# Patient Record
Sex: Female | Born: 1955 | Race: White | Hispanic: No | Marital: Single | Smoking: Never smoker
Health system: Southern US, Community
[De-identification: ages and names within clinical notes are randomized; demographics above are authoritative.]

## PROBLEM LIST (undated history)

## (undated) DIAGNOSIS — K5790 Diverticulosis of intestine, part unspecified, without perforation or abscess without bleeding: Secondary | ICD-10-CM

## (undated) DIAGNOSIS — Z9989 Dependence on other enabling machines and devices: Secondary | ICD-10-CM

## (undated) DIAGNOSIS — E663 Overweight: Secondary | ICD-10-CM

## (undated) DIAGNOSIS — R112 Nausea with vomiting, unspecified: Secondary | ICD-10-CM

## (undated) DIAGNOSIS — E785 Hyperlipidemia, unspecified: Secondary | ICD-10-CM

## (undated) DIAGNOSIS — N939 Abnormal uterine and vaginal bleeding, unspecified: Secondary | ICD-10-CM

## (undated) DIAGNOSIS — Z5189 Encounter for other specified aftercare: Secondary | ICD-10-CM

## (undated) DIAGNOSIS — Z9889 Other specified postprocedural states: Secondary | ICD-10-CM

## (undated) DIAGNOSIS — N912 Amenorrhea, unspecified: Secondary | ICD-10-CM

## (undated) DIAGNOSIS — N643 Galactorrhea not associated with childbirth: Secondary | ICD-10-CM

## (undated) DIAGNOSIS — G4733 Obstructive sleep apnea (adult) (pediatric): Secondary | ICD-10-CM

## (undated) DIAGNOSIS — K635 Polyp of colon: Secondary | ICD-10-CM

## (undated) DIAGNOSIS — K760 Fatty (change of) liver, not elsewhere classified: Secondary | ICD-10-CM

## (undated) DIAGNOSIS — M1990 Unspecified osteoarthritis, unspecified site: Secondary | ICD-10-CM

## (undated) DIAGNOSIS — F329 Major depressive disorder, single episode, unspecified: Secondary | ICD-10-CM

## (undated) DIAGNOSIS — R896 Abnormal cytological findings in specimens from other organs, systems and tissues: Secondary | ICD-10-CM

## (undated) DIAGNOSIS — IMO0001 Reserved for inherently not codable concepts without codable children: Secondary | ICD-10-CM

## (undated) DIAGNOSIS — F32A Depression, unspecified: Secondary | ICD-10-CM

## (undated) DIAGNOSIS — N858 Other specified noninflammatory disorders of uterus: Secondary | ICD-10-CM

## (undated) DIAGNOSIS — I10 Essential (primary) hypertension: Secondary | ICD-10-CM

## (undated) HISTORY — PX: HYSTEROSCOPY: SHX211

## (undated) HISTORY — DX: Hyperlipidemia, unspecified: E78.5

## (undated) HISTORY — DX: Encounter for other specified aftercare: Z51.89

## (undated) HISTORY — PX: COSMETIC SURGERY: SHX468

## (undated) HISTORY — DX: Reserved for inherently not codable concepts without codable children: IMO0001

## (undated) HISTORY — DX: Depression, unspecified: F32.A

## (undated) HISTORY — DX: Other specified noninflammatory disorders of uterus: N85.8

## (undated) HISTORY — DX: Amenorrhea, unspecified: N91.2

## (undated) HISTORY — PX: ROTATOR CUFF REPAIR: SHX139

## (undated) HISTORY — DX: Essential (primary) hypertension: I10

## (undated) HISTORY — PX: TEAR DUCT PROBING: SHX793

## (undated) HISTORY — DX: Overweight: E66.3

## (undated) HISTORY — PX: CATARACT EXTRACTION: SUR2

## (undated) HISTORY — DX: Polyp of colon: K63.5

## (undated) HISTORY — DX: Abnormal uterine and vaginal bleeding, unspecified: N93.9

## (undated) HISTORY — DX: Major depressive disorder, single episode, unspecified: F32.9

## (undated) HISTORY — DX: Abnormal cytological findings in specimens from other organs, systems and tissues: R89.6

## (undated) HISTORY — DX: Diverticulosis of intestine, part unspecified, without perforation or abscess without bleeding: K57.90

## (undated) HISTORY — DX: Galactorrhea not associated with childbirth: N64.3

## (undated) HISTORY — PX: DILATION AND CURETTAGE OF UTERUS: SHX78

## (undated) HISTORY — PX: KNEE ARTHROPLASTY: SHX992

## (undated) HISTORY — PX: KNEE ARTHROSCOPY: SUR90

## (undated) HISTORY — DX: Obstructive sleep apnea (adult) (pediatric): G47.33

## (undated) HISTORY — PX: JOINT REPLACEMENT: SHX530

## (undated) HISTORY — DX: Dependence on other enabling machines and devices: Z99.89

## (undated) HISTORY — DX: Unspecified osteoarthritis, unspecified site: M19.90

## (undated) HISTORY — DX: Fatty (change of) liver, not elsewhere classified: K76.0

---

## 1997-10-25 ENCOUNTER — Other Ambulatory Visit: Admission: RE | Admit: 1997-10-25 | Discharge: 1997-10-25 | Payer: Self-pay | Admitting: Obstetrics and Gynecology

## 1999-04-03 ENCOUNTER — Other Ambulatory Visit: Admission: RE | Admit: 1999-04-03 | Discharge: 1999-04-03 | Payer: Self-pay | Admitting: Obstetrics and Gynecology

## 2000-10-19 ENCOUNTER — Inpatient Hospital Stay (HOSPITAL_COMMUNITY): Admission: RE | Admit: 2000-10-19 | Discharge: 2000-10-21 | Payer: Self-pay | Admitting: Orthopedic Surgery

## 2001-01-28 ENCOUNTER — Other Ambulatory Visit: Admission: RE | Admit: 2001-01-28 | Discharge: 2001-01-28 | Payer: Self-pay | Admitting: Obstetrics and Gynecology

## 2002-03-02 ENCOUNTER — Other Ambulatory Visit: Admission: RE | Admit: 2002-03-02 | Discharge: 2002-03-02 | Payer: Self-pay | Admitting: Obstetrics and Gynecology

## 2002-12-06 ENCOUNTER — Other Ambulatory Visit: Admission: RE | Admit: 2002-12-06 | Discharge: 2002-12-06 | Payer: Self-pay | Admitting: Obstetrics and Gynecology

## 2003-02-02 ENCOUNTER — Encounter (INDEPENDENT_AMBULATORY_CARE_PROVIDER_SITE_OTHER): Payer: Self-pay | Admitting: *Deleted

## 2003-02-02 ENCOUNTER — Ambulatory Visit (HOSPITAL_COMMUNITY): Admission: RE | Admit: 2003-02-02 | Discharge: 2003-02-02 | Payer: Self-pay | Admitting: Obstetrics and Gynecology

## 2003-10-25 ENCOUNTER — Ambulatory Visit (HOSPITAL_COMMUNITY): Admission: RE | Admit: 2003-10-25 | Discharge: 2003-10-25 | Payer: Self-pay | Admitting: Obstetrics and Gynecology

## 2004-02-13 ENCOUNTER — Other Ambulatory Visit: Admission: RE | Admit: 2004-02-13 | Discharge: 2004-02-13 | Payer: Self-pay | Admitting: Obstetrics and Gynecology

## 2005-07-09 ENCOUNTER — Other Ambulatory Visit: Admission: RE | Admit: 2005-07-09 | Discharge: 2005-07-09 | Payer: Self-pay | Admitting: Obstetrics and Gynecology

## 2005-12-17 LAB — HM COLONOSCOPY

## 2006-07-08 ENCOUNTER — Ambulatory Visit: Payer: Self-pay

## 2006-07-15 ENCOUNTER — Ambulatory Visit: Payer: Self-pay

## 2006-07-21 ENCOUNTER — Ambulatory Visit: Payer: Self-pay

## 2006-08-24 ENCOUNTER — Ambulatory Visit: Payer: Self-pay

## 2008-05-15 ENCOUNTER — Encounter: Admission: RE | Admit: 2008-05-15 | Discharge: 2008-05-15 | Payer: Self-pay | Admitting: Orthopedic Surgery

## 2008-09-12 DIAGNOSIS — I82409 Acute embolism and thrombosis of unspecified deep veins of unspecified lower extremity: Secondary | ICD-10-CM | POA: Insufficient documentation

## 2008-09-12 DIAGNOSIS — I10 Essential (primary) hypertension: Secondary | ICD-10-CM | POA: Insufficient documentation

## 2008-09-12 DIAGNOSIS — I809 Phlebitis and thrombophlebitis of unspecified site: Secondary | ICD-10-CM | POA: Insufficient documentation

## 2009-09-05 ENCOUNTER — Ambulatory Visit: Payer: Self-pay | Admitting: Vascular Surgery

## 2009-09-20 ENCOUNTER — Encounter: Admission: RE | Admit: 2009-09-20 | Discharge: 2009-09-20 | Payer: Self-pay | Admitting: Orthopedic Surgery

## 2009-11-07 ENCOUNTER — Ambulatory Visit (HOSPITAL_COMMUNITY): Admission: RE | Admit: 2009-11-07 | Discharge: 2009-11-07 | Payer: Self-pay | Admitting: Neurosurgery

## 2009-12-04 ENCOUNTER — Ambulatory Visit: Payer: Self-pay | Admitting: Vascular Surgery

## 2010-01-02 ENCOUNTER — Ambulatory Visit: Payer: Self-pay | Admitting: Vascular Surgery

## 2010-01-09 ENCOUNTER — Ambulatory Visit: Payer: Self-pay | Admitting: Vascular Surgery

## 2010-02-17 ENCOUNTER — Encounter: Payer: Self-pay | Admitting: Neurosurgery

## 2010-02-20 ENCOUNTER — Ambulatory Visit
Admission: RE | Admit: 2010-02-20 | Discharge: 2010-02-20 | Payer: Self-pay | Source: Home / Self Care | Attending: Vascular Surgery | Admitting: Vascular Surgery

## 2010-02-21 NOTE — Assessment & Plan Note (Signed)
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  She underwent successful ablation from the proximal calf to just below the saphenofemoral junction under local anesthesia without immediate complications.  She will be seen again in 1 week for continued follow-up.    Larina Earthly, M.D. Electronically Signed  TFE/MEDQ  D:  02/20/2010  T:  02/21/2010  Job:  [id number]

## 2010-02-27 ENCOUNTER — Ambulatory Visit: Payer: Self-pay

## 2010-02-27 ENCOUNTER — Encounter (INDEPENDENT_AMBULATORY_CARE_PROVIDER_SITE_OTHER): Payer: BC Managed Care – PPO

## 2010-02-27 ENCOUNTER — Ambulatory Visit (INDEPENDENT_AMBULATORY_CARE_PROVIDER_SITE_OTHER): Payer: BC Managed Care – PPO | Admitting: Vascular Surgery

## 2010-02-27 DIAGNOSIS — Z48812 Encounter for surgical aftercare following surgery on the circulatory system: Secondary | ICD-10-CM

## 2010-02-27 DIAGNOSIS — I87399 Chronic venous hypertension (idiopathic) with other complications of unspecified lower extremity: Secondary | ICD-10-CM

## 2010-02-27 DIAGNOSIS — I83893 Varicose veins of bilateral lower extremities with other complications: Secondary | ICD-10-CM

## 2010-02-28 ENCOUNTER — Ambulatory Visit (INDEPENDENT_AMBULATORY_CARE_PROVIDER_SITE_OTHER): Payer: BC Managed Care – PPO

## 2010-02-28 ENCOUNTER — Ambulatory Visit: Admit: 2010-02-28 | Payer: Self-pay | Admitting: Vascular Surgery

## 2010-02-28 DIAGNOSIS — I83893 Varicose veins of bilateral lower extremities with other complications: Secondary | ICD-10-CM

## 2010-03-08 NOTE — Procedures (Unsigned)
DUPLEX DEEP VENOUS EXAM - LOWER EXTREMITY  INDICATION:  Right greater saphenous vein laser ablation.  HISTORY:  Edema:  No. Trauma/Surgery:  Left greater saphenous vein laser ablation on 01/02/2010, right greater saphenous vein laser ablation on 02/20/2010. Pain:  No. PE:  No. Previous DVT:  No. Anticoagulants: Other:  DUPLEX EXAM:               CFV   SFV   PopV  PTV    GSV               R  L  R  L  R  L  R   L  R  L Thrombosis    o  o  o     o     o      + Spontaneous   +  +  +     +     +      o Phasic        +  +  +     +     +      o Augmentation  +  +  +     +     +      o Compressible  +  +  +     +     +      o Competent     +  0  +     o     +      o  Legend:  + - yes  o - no  p - partial  D - decreased  IMPRESSION: 1. The right greater saphenous vein appears totally occluded from the     distal insertion site to 0.6 cm from the right saphenofemoral     junction. 2. No evidence of deep venous thrombosis noted in the right lower     extremity. 3. Reflux of >500 milliseconds noted in the right popliteal vein, the     right saphenofemoral junction, and in the left common femoral vein.   _____________________________ Larina Earthly, M.D.  CH/MEDQ  D:  02/27/2010  T:  02/27/2010  Job:  [id number]

## 2010-03-11 NOTE — Assessment & Plan Note (Signed)
OFFICE VISIT  Allison Mcdaniel, Allison Mcdaniel DOB:  [birth year]                                       02/27/2010 NIDPO#:[id number]  This patient is status post right great saphenous vein ablation 1 week ago.  She had a left leg ablation  approximately 1 month ago.  She has the usual amount of mild soreness in her medial thigh from the ablation; otherwise has returned to her normal baseline.  She does have a duplex today which showed closure of her great saphenous vein and no evidence of DVT.  I am pleased with her result as is this patient.  She does have some prominent, painful, large  telangiectasia over her medial thighs bilaterally and will initiate sclerotherapy with these.    Larina Earthly, M.D. Electronically Signed  TFE/MEDQ  D:  02/27/2010  T:  02/28/2010  Job:  [id number]

## 2010-03-28 ENCOUNTER — Ambulatory Visit (INDEPENDENT_AMBULATORY_CARE_PROVIDER_SITE_OTHER): Payer: BC Managed Care – PPO

## 2010-03-28 DIAGNOSIS — I83893 Varicose veins of bilateral lower extremities with other complications: Secondary | ICD-10-CM

## 2010-04-11 LAB — GLUCOSE, CAPILLARY: Glucose-Capillary: 142 mg/dL — ABNORMAL HIGH (ref 70–99)

## 2010-06-11 NOTE — Assessment & Plan Note (Signed)
OFFICE VISIT   CHRISTLE, NOLTING  DOB:  [birth year]                                       01/02/2010  ZOXWR#:[id number]   The patient presents today for treatment of her left great saphenous  vein hypertension.  She underwent successful laser ablations from below  the knee to just below the saphenofemoral junction.  She had no  immediate complication and will be seen again in 1 week with duplex  followup.     Larina Earthly, M.D.  Electronically Signed   TFE/MEDQ  D:  01/02/2010  T:  01/03/2010  Job:  [id number]

## 2010-06-11 NOTE — Assessment & Plan Note (Signed)
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  She has  worn compression garments for 3 months since my last visit with her and  still reports pain despite this.  She does elevate her legs when  possible, does take ibuprofen for discomfort.  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  She also reports the  physical therapy requires standing when treating the patients and also  difficulty due to leg pain and has to take frequent breaks and also  disrupts her sleep due to the pain after she has been standing for great  periods of time during the day.   On physical exam there is no change.  She does not have any varicosities  but does have extensive telangiectasia bilaterally.  I reimaged her  saphenous vein with the SonoSite and this does show enlarged saphenous  vein with reflux bilaterally.  I have recommended staged bilateral laser  ablation of her great saphenous vein for reduction of hypertension.  I  did explain that her symptoms are quite typical for venous hypertension  but that there are other etiologies for leg discomfort as well.  There  is no assurance that ablation will completely resolve this issue.  She  understands and wishes to proceed at her earliest convenience.     Larina Earthly, M.D.  Electronically Signed   TFE/MEDQ  D:  12/04/2009  T:  12/04/2009  Job:  [id number]

## 2010-06-11 NOTE — Assessment & Plan Note (Signed)
OFFICE VISIT   BILL, MCVEY  DOB:  1955/10/29                                       01/09/2010  ZOXWR#:[id number]   Allison Mcdaniel presents today for a 1 week follow-up of her left leg great  saphenous ablation.  She has done quite well following the procedure  with the usual amount of soreness in her medial thigh.  She does have  some irritation in the skin directly behind her popliteal space in the  skin fold from the compression garment.  She will treat this with  Neosporin and keep this covered under the compression which she will  wear for an additional 1 week.  She is scheduled for right leg ablation  and will do this around her work schedule.  She will notify us when she  wishes to proceed.     Larina Earthly, M.D.  Electronically Signed   TFE/MEDQ  D:  01/09/2010  T:  01/10/2010  Job:  [id number]

## 2010-06-11 NOTE — Procedures (Signed)
LOWER EXTREMITY VENOUS REFLUX EXAM   INDICATION:  Varicose veins with pain and swelling.   EXAM:  Using color-flow imaging and pulse Doppler spectral analysis, the  bilateral common femoral, superficial femoral, popliteal, posterior  tibial, greater and lesser saphenous veins are evaluated.  There is no  evidence suggesting deep venous insufficiency in the bilateral lower  extremities.   The bilateral saphenofemoral junctions are not competent with reflux of  >575milliseconds. The bilateral GSV's are not competent with reflux of  >560milliseconds with the calibers as described below.   The bilateral proximal short saphenous vein demonstrate competency.   GSV Diameter (used if found to be incompetent only)                                            Right    Left  Proximal Greater Saphenous Vein           0.56 cm  0.47 cm  Proximal-to-mid-thigh                     cm       cm  Mid thigh                                 0.44 cm  0.49 cm  Mid-distal thigh                          cm       cm  Distal thigh                              0.48 cm  0.45 cm  Knee                                      0.53 cm  0.47 cm   IMPRESSION:  1. Bilateral greater saphenous vein reflux with >525milliseconds is      identified with the caliber ranging from 0.44 cm to 0.56 cm knee to      groin on the right leg and 0.45 to 0.49 on the left leg.  2. The bilateral deep greater saphenous veins are not aneurysmal.  3. The bilateral greater saphenous veins are not tortuous.  4. The deep venous system is competent.  5. The bilateral lesser saphenous veins are competent.           ___________________________________________  Larina Earthly, M.D.   CB/MEDQ  D:  09/06/2009  T:  09/06/2009  Job:  [id number]

## 2010-06-11 NOTE — Consult Note (Signed)
NEW PATIENT CONSULTATION   Mcdaniel, Allison C  DOB:  [birth year]                                       09/05/2009  AVWUJ#:[id number]   The patient presents today for evaluation of bilateral lower extremity  swelling.  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  This is from her knee distally bilaterally and is somewhat  more so on her left leg than right leg.  She does have a history of calf  vein DVT after orthopedic surgery on her knee in 1976 on the left leg  and on the right leg in 2008 after an arthroscopy .  She has worn  compression garments for many years and gets some mild relief from  these.  Her pain has become progressive over the past several years.  She is in our office to determine if there are any options for improved  pain relief.   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  She does have hypertension.  She has had multiple orthopedic  treatments in both the upper and lower extremities.   Family history is negative for premature atherosclerotic disease.   SOCIAL HISTORY:  She is single.  She does not have children.  She is a  physical therapist.  She does not smoke and does have 1 to 2 beers per  month.   REVIEW OF SYSTEMS:  Is negative for weight loss or weight gain.  She  weighs 240 pounds.  She is 5 feet 8 inches tall.  Cardiac, pulmonary, GI, GU are all negative.  VASCULAR:  Positive for her phlebitis as noted above.  NEUROLOGIC:  Negative.  MUSCULOSKELETAL:  Positive for arthritis, joint pain.  PSYCHIATRIC:  Depression.  ENT, hematologic again are all negative.   PHYSICAL EXAM:  A well-developed moderately obese white female appearing  her stated age.  Blood pressure is 126/82 pulse 80, respirations 18.  She is in no acute stress.  HEENT:  Normal.  Abdomen:  Moderately obese  and nontender.  Pulse status:  She  has 2+ radial and 2+ dorsalis pedis  pulses bilaterally.  Musculoskeletal:  Shows marked surgical changes in  her right knee from prior orthopedic surgeries.  She does not have any  cyanosis bilaterally.  She does have moderate edema bilaterally.  She  does have extensive telangiectasia over both lower extremities.  Neurological:  No focal weakness, paresthesias.  Skin:  Without ulcers  or rashes.   She underwent noninvasive laboratory studies in our office.  This shows  no evidence of deep venous reflux.  She does have reflux in her right  great saphenous vein from her groin to below the knee position.  On the  left she has enlarged saphenous vein throughout its course with reflux  at the saphenofemoral junction in the knee into the calf.  I discussed  options with the patient.  She has been wearing her compression garments  but I have again instructed her on the use of these with thigh high 20  to 30 mmHg.  She will wear these daily and we will see her again in 3  months to determine if daily use of compression garments will aid her  symptoms.  She would be a good candidate for staged bilateral  laser  ablation of her saphenous vein for relief of her venous hypertension.  We would recommend left leg then right leg since the left leg is most  symptomatic.  We will discuss this with her further in 3 months.     Larina Earthly, M.D.  Electronically Signed   TFE/MEDQ  D:  09/05/2009  T:  09/06/2009  Job:  [id number]

## 2010-06-11 NOTE — Procedures (Signed)
DUPLEX DEEP VENOUS EXAM - LOWER EXTREMITY   INDICATION:  One week post left greater saphenous vein EVLT.   HISTORY:  Edema:  Left.  Trauma/Surgery:  01/02/10, left greater saphenous vein EVLT.  Pain:  Left.  PE:  No.  Previous DVT:  History of DVT and phlebitis.  Anticoagulants:  No.  Other:   DUPLEX EXAM:                CFV   SFV   PopV  PTV    GSV                R  L  R  L  R  L  R   L  R  L  Thrombosis    o  o     o     o      o     +  Spontaneous   +  +     +     +      +     o  Phasic        +  +     +     +      +     o  Augmentation  +  +     +     +      +     o  Compressible  +  +     +     +      +     o  Competent     +  +     +     +      +     o   Legend:  + - yes  o - no  p - partial  D - decreased   IMPRESSION:  1. Left leg appears to be negative for deep venous thrombosis.  2. Left greater saphenous vein is thrombosed due to EVLT.  3. Right common femoral vein appears to be negative for deep venous      thrombosis.    _____________________________  Larina Earthly, M.D.   NT/MEDQ  D:  01/09/2010  T:  01/09/2010  Job:  [id number]

## 2010-06-14 NOTE — Op Note (Signed)
Allison Mcdaniel, Allison Mcdaniel                 ACCOUNT NO.:  [id number]   MEDICAL RECORD NO.:  [id number]          PATIENT TYPE:  AMB   LOCATION:  SDC                           FACILITY:  [facility]   PHYSICIAN:  Hal Morales, M.D.DATE OF BIRTH:  1955-06-18   DATE OF PROCEDURE:  10/25/2003  DATE OF DISCHARGE:                                 OPERATIVE REPORT   PREOPERATIVE DIAGNOSES:  Menorrhagia.   POSTOPERATIVE DIAGNOSES:  Menorrhagia.   OPERATION:  Novasure endometrial ablation.   ANESTHESIA:  General oral tracheal.   ESTIMATED BLOOD LOSS:  Less than 10 mL.   COMPLICATIONS:  None.   FINDINGS:  The uterus sounded to 9 cm with a 3 cm cervical length.   DESCRIPTION OF PROCEDURE:  The patient was taken to the operating room after  appropriate identification and placed on the operating table. After the  attainment of adequate general anesthesia, she was placed in the lithotomy  position. The perineum and vagina were prepped with multiple layers of  Betadine and draped as a sterile field. A Grave's speculum was used to  visualize the cervix and the cervical length measured with a Hegar dilator.  The uterus was then sounded to 9 cm. The Novasure apparatus was then set at  6 cm.  It was then inserted into the endometrial cavity after the cervix had  been dilated to a #23 dilator.  The Novasure apparatus was advanced to the  fundus and then withdrawn 2 cm.  The array was then opened and the cavity  width noted initially to be 2.25 cm.  Once the maneuvers were completed to  allow full opening of the array, the cavity width was noted to be 4.6 cm.  At this point, the test mode was activated and the cavity noted to be  intact.  The Novasure ablation was then begun and completed after 44  seconds.  The Novasure array was then unlocked and retracted back into the  holder and then removed from the endometrial cavity. The tenaculum which had  been used to hold the cervix in place was released and silver  nitrate  applied to the bleeding sites from the tenaculum. Hemostasis was then noted  to be adequate and all instruments removed from the vagina.  It should be  noted that prior to insertion of the Grave's speculum, the patient had been  catheterized with a red Robinson catheter under sterile condition. Once all  instruments had been removed, the patient was awakened from general  anesthesia and taken to the recovery room in satisfactory condition having  tolerated the procedure well with sponge and instrument counts correct.      VPH/MEDQ  D:  10/25/2003  T:  10/25/2003  Job:  [id number]

## 2010-06-14 NOTE — Op Note (Signed)
NAME:  Allison Mcdaniel, Allison Mcdaniel                           ACCOUNT NO.:  [id number]   MEDICAL RECORD NO.:  [id number]                   PATIENT TYPE:  AMB   LOCATION:  SDC                                  FACILITY:  [facility]   PHYSICIAN:  Hal Morales, M.D.             DATE OF BIRTH:  07-Mar-1955   DATE OF PROCEDURE:  02/02/2003  DATE OF DISCHARGE:                                 OPERATIVE REPORT   PREOPERATIVE DIAGNOSIS:  Intermenstrual bleeding.   POSTOPERATIVE DIAGNOSES:  1. Intermenstrual bleeding.  2. Endometrial polyp.   OPERATION:  1. Diagnostic hysteroscopy.  2. Polypectomy.  3. Endometrial curettage.   SURGEON:  Hal Morales, M.D.   ANESTHESIA:  General orotracheal.   ESTIMATED BLOOD LOSS:  Less than 50 mL.   COMPLICATIONS:  None.   FINDINGS:  The uterus sounded to 8 cm.  At the time of hysteroscopy there  were several polyps noted, first on the anterior fundus of the uterus, then  on the right fundus near the right tubal ostium.   SPECIMENS TO PATHOLOGY:  Endometrial curettings, endometrial polyps,  endocervical curettings.   DESCRIPTION OF PROCEDURE:  The patient was taken to the operating room after  appropriate identification and placed on the operating table.  After the  attainment of adequate general anesthesia, she was placed in the lithotomy  position.  Care was taken to place the patient so as not to stress the left  knee, which is status post reconstruction.  The perineum was then draped as  a sterile field.  A red Robinson catheter was used to empty the bladder.  The perineum was draped as a sterile field and the Graves speculum placed in  the vagina and a single-tooth tenaculum placed on the anterior cervix.  A  paracervical block was achieved with a total of 10 mL of 2% Xylocaine at the  5 and 7 o'clock positions.  The uterus was sounded to 8 cm.  The cervix was  dilated to accommodate the diagnostic hysteroscope.  The diagnostic  hysteroscope was then  used to achieve the above-noted findings.  The  diagnostic hysteroscope was removed and the Randall stone forceps used to  remove the aforementioned polyps.  Sharp curettage of all quadrants of the  uterus was then undertaken and the hysteroscope replaced to document that  the aforementioned polyp had been removed.  All instruments were then  removed from the vagina and the patient was  awakened from general anesthesia and taken to the recovery room in  satisfactory condition, having tolerated the procedure well with sponge and  instrument counts correct.  The deficit at the time of the end of  hysteroscopy was 30 mL.  Hal Morales, M.D.    VPH/MEDQ  D:  02/02/2003  T:  02/02/2003  Job:  [id number]

## 2010-06-14 NOTE — Procedures (Signed)
[place]  Patient:    Allison Mcdaniel, Allison Mcdaniel Visit Number: [id number] MRN: [id number]          Service Type: SUR Location: [facility] 2550 04 Attending Physician:  Twana First Dictated by:   Burna Forts, M.D. Proc. Date: 10/19/00 Admit Date:  10/19/2000                             Procedure Report  PREOPERATIVE DIAGNOSIS:  Degenerative joint disease of the knee.  OPERATIVE PROCEDURE:  Total knee replacement performed by Dr. Hadassah Pais.  ANESTHESIA:  Epidural catheter for postoperative analgesia.  PROCEDURE:  Epidural catheter for postoperative analgesia.  Preoperative risks and benefits of the placement of the epidural catheter for use for postoperative analgesia were discussed with the patient in detail, including alternatives for pain control.  Additionally, this had been requested by her attending surgeon Dr. Thurston Hole for postoperative analgesia. The patient accepted the placement of the epidural catheter for postoperative analgesic care.  At the end of the operative procedure the patient was turned onto the left lateral decubitus position, a sterile prep of the lumbar area was conducted using a #17 gauge Tuohy needle adjacent to the L1-2 interspace.  The epidural space was contacted with a loss of resistance technique.  A catheter threaded with ease, approximately 2 to 3 cm beyond the needle tip, and the needle was removed.  After negative aspiration for heme and CSF, the catheter was incrementally injected with a total of 10 cc of 0.25% Marcaine containing 100 mcg of phentanyl.  This was secured in place with tape.  The patient turned supine, transferred to the [facility] in stable condition. Dictated by:   Burna Forts, M.D. Attending Physician:  Twana First DD:  10/19/00 TD:  10/19/00 Job: [id number]

## 2010-06-14 NOTE — Op Note (Signed)
[place]  Patient:    Allison Mcdaniel, Allison Mcdaniel Visit Number: [id number] MRN: [id number]          Service Type: SUR Location: RCRM 2550 04 Attending Physician:  Twana First Dictated by:   Elana Alm Thurston Hole, M.D. Proc. Date: 10/19/00 Admit Date:  10/19/2000                             Operative Report  PREOPERATIVE DIAGNOSIS:  Left knee degenerative joint disease.  POSTOPERATIVE DIAGNOSIS:  Left knee degenerative joint disease.  PROCEDURE:  Left total knee replacement using Osteonics Scorpio total knee system with #9 cemented femoral component, #9 cemented tibial component with 15-mm polyethylene tibial spacer and 26-mm polyethylene cemented patella.  SURGEON:  Elana Alm. Thurston Hole, M.D.  ASSISTANT:  Julien Girt, P.A.  ANESTHESIA:  General.  OPERATIVE TIME:  1 hour and 25 minutes.  COMPLICATIONS:  None.  DESCRIPTION OF PROCEDURE:  Allison Mcdaniel was brought to the operating room on October 19, 2000, placed on the operative table in the supine position. After an adequate level of general anesthesia was obtained, her left knee was examined under anesthesia.  Moderate varus deformity noted.  Range of motion 0-125 degrees.  Knee stable ligamentous exam with normal patella tracking. After this was done, the knee was prepped using sterile Betadine and draped using sterile technique.  She had a Foley catheter placed under sterile conditions and received Ancef 1 g IV preoperatively for prophylaxis.  The left leg was exsanguinated and a thigh tourniquet elevated to 375 mm.  Initially, through a 25-cm longitudinal incision based over the patella, initial exposure was made.  The underlying subcutaneous tissues were incised in line with skin incision.  A median arthrotomy was performed revealing excessive amount of normal-appearing joint fluid.  The articular surfaces in the knee were examined.  She had grade 4 changes medially, grade 3 changes  laterally, and grade 3 and 4 changes in the patellofemoral joint.  Osteophytes were removed from the femoral condyles and the patella.  The medial and lateral meniscal remnants were removed, as well as the anterior cruciate ligament.  An intramedullary drill was then drilled up the femoral canal for placement of the distal femoral cutting jig, which was placed in the appropriate amount of rotation and a distal 10-mm cut was made.  The distal femur was incised.  A #9 was felt to be the appropriate size and a #9 cutting jig was placed and then these cuts were made.  The proximal tibia was exposed.  Tibia spines were removed with an oscillating saw.  Intramedullary drill drilled down the tibial canal for placement of the proximal tibial cutting jig.  This was placed and a 4-mm recessed cut was made based off the medial side.  After this was done, then the Scorpio PCL cutter was placed back on the distal femur and these cuts were made.  At this point then, the #9 femoral trial was placed and #9 tibial trial baseplate was placed with a 15-mm polyethylene spacer, the knee taken through a range of motion and found to be stable, the varus deformity found to be corrected.  At this point, the tibial baseplate was marked for rotation and then the keel cut was made.  After this was done, the patella was sized.  A 26-mm was found to be the appropriate size and a recessed 10 x 26 mm cut was made and three locking holes  were placed.  After this was done, it was felt that all the trial components were of excellent size, fit, stability.  They were removed.  The knee was then gently lavage irrigated with 3 L of saline. The proximal tibia was then exposed.  The #9 tibial baseplate was placed with cement backing and hammered into position with an excellent fit with excess cement being removed from around the edges.  The femoral component #9 with cement backing was hammered into position, also with an excellent  fit, with excess cement being removed from around the edges.  The 15-mm polyethylene spacer was locked on the tibial baseplate, the knee taken through a range of motion 0-125 degrees with excellent stability, and no lift-off on the tray. The 26-mm patella was then locked into its recessed hole with cement backing and excess cement being removed from around the edges.  After the cement hardened, patellofemoral tracking was evaluated.  This was found to be normal with no need for a lateral release.  At this point, it was felt that all the components were of excellent size, fit, and stability.  The knee was further irrigated with antibiotic solution and then the arthrotomy was closed with #1 Ethibond suture over two medium Hemovac drains.  Subcutaneous tissues were closed with 0 and 2-0 Vicryl, skin closed with skin staples, sterile dressings were applied, and a long leg splint.  Hemovac injected with 0.25% Marcaine with epinephrine and clamp.  Tourniquet was released.  The patient then had an epidural catheter placed by anesthesia for postoperative pain control.  She was then awakened and taken to the recovery room in stable condition.  Needle and sponge counts were correct x 2 at the end of the case. Dictated by:   Elana Alm Thurston Hole, M.D. Attending Physician:  Twana First DD:  10/19/00 TD:  10/19/00 Job: [id number]
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
## 2010-07-18 LAB — HM MAMMOGRAPHY: HM Mammogram: NORMAL

## 2010-09-27 ENCOUNTER — Other Ambulatory Visit: Payer: Self-pay | Admitting: Dermatology

## 2011-02-17 ENCOUNTER — Ambulatory Visit (INDEPENDENT_AMBULATORY_CARE_PROVIDER_SITE_OTHER): Payer: BC Managed Care – PPO | Admitting: Family Medicine

## 2011-02-17 ENCOUNTER — Encounter: Payer: Self-pay | Admitting: Family Medicine

## 2011-02-17 DIAGNOSIS — G4733 Obstructive sleep apnea (adult) (pediatric): Secondary | ICD-10-CM | POA: Insufficient documentation

## 2011-02-17 DIAGNOSIS — K760 Fatty (change of) liver, not elsewhere classified: Secondary | ICD-10-CM | POA: Insufficient documentation

## 2011-02-17 DIAGNOSIS — Z9989 Dependence on other enabling machines and devices: Secondary | ICD-10-CM | POA: Insufficient documentation

## 2011-02-17 DIAGNOSIS — F32A Depression, unspecified: Secondary | ICD-10-CM | POA: Insufficient documentation

## 2011-02-17 DIAGNOSIS — K5730 Diverticulosis of large intestine without perforation or abscess without bleeding: Secondary | ICD-10-CM | POA: Insufficient documentation

## 2011-02-17 DIAGNOSIS — E785 Hyperlipidemia, unspecified: Secondary | ICD-10-CM

## 2011-02-17 DIAGNOSIS — I10 Essential (primary) hypertension: Secondary | ICD-10-CM

## 2011-02-17 DIAGNOSIS — F329 Major depressive disorder, single episode, unspecified: Secondary | ICD-10-CM

## 2011-02-17 DIAGNOSIS — E119 Type 2 diabetes mellitus without complications: Secondary | ICD-10-CM | POA: Insufficient documentation

## 2011-02-17 LAB — HEMOGLOBIN A1C: Hgb A1c MFr Bld: 7.5 % — AB (ref 4.0–6.0)

## 2011-03-19 ENCOUNTER — Encounter: Payer: Self-pay | Admitting: *Deleted

## 2011-03-19 ENCOUNTER — Ambulatory Visit (INDEPENDENT_AMBULATORY_CARE_PROVIDER_SITE_OTHER): Payer: BC Managed Care – PPO | Admitting: *Deleted

## 2011-03-19 DIAGNOSIS — I83893 Varicose veins of bilateral lower extremities with other complications: Secondary | ICD-10-CM

## 2011-03-19 DIAGNOSIS — I781 Nevus, non-neoplastic: Secondary | ICD-10-CM

## 2011-03-19 NOTE — Progress Notes (Signed)
X=.3% Sotradecol administered with a 27g butterfly.  Patient received a total of 12cc. Treated all I could with 2 syringes. She'll need more treatments. Will follow prn.   Photos: yes  Compression stockings applied: yes

## 2011-03-20 ENCOUNTER — Encounter: Payer: Self-pay | Admitting: Vascular Surgery

## 2011-03-30 ENCOUNTER — Other Ambulatory Visit: Payer: Self-pay | Admitting: Family Medicine

## 2011-04-19 ENCOUNTER — Ambulatory Visit (INDEPENDENT_AMBULATORY_CARE_PROVIDER_SITE_OTHER): Payer: BC Managed Care – PPO | Admitting: Family Medicine

## 2011-04-19 ENCOUNTER — Ambulatory Visit: Payer: BC Managed Care – PPO

## 2011-04-19 DIAGNOSIS — R109 Unspecified abdominal pain: Secondary | ICD-10-CM

## 2011-04-19 LAB — POCT URINALYSIS DIPSTICK
Bilirubin, UA: NEGATIVE
Blood, UA: NEGATIVE
Glucose, UA: NEGATIVE
Ketones, UA: NEGATIVE
Leukocytes, UA: NEGATIVE
Nitrite, UA: NEGATIVE
Protein, UA: NEGATIVE
Spec Grav, UA: 1.015
Urobilinogen, UA: 0.2
pH, UA: 7.5

## 2011-04-19 LAB — POCT CBC
Granulocyte percent: 57.3 %G (ref 37–80)
Hemoglobin: 12.8 g/dL (ref 12.2–16.2)
Lymph, poc: 2.6 (ref 0.6–3.4)
MCHC: 32.3 g/dL (ref 31.8–35.4)
MCV: 92.4 fL (ref 80–97)
MID (cbc): 0.5 (ref 0–0.9)
POC Granulocyte: 4.1 (ref 2–6.9)
POC LYMPH PERCENT: 36.2 %L (ref 10–50)
POC MID %: 6.5 %M (ref 0–12)
Platelet Count, POC: 308 10*3/uL (ref 142–424)
RBC: 4.29 M/uL (ref 4.04–5.48)
RDW, POC: 13.4 %

## 2011-04-19 LAB — POCT UA - MICROSCOPIC ONLY
Casts, Ur, LPF, POC: NEGATIVE
Crystals, Ur, HPF, POC: NEGATIVE
Mucus, UA: NEGATIVE
RBC, urine, microscopic: NEGATIVE
Yeast, UA: NEGATIVE

## 2011-04-19 LAB — COMPREHENSIVE METABOLIC PANEL
AST: 24 U/L (ref 0–37)
Albumin: 4.5 g/dL (ref 3.5–5.2)
Alkaline Phosphatase: 81 U/L (ref 39–117)
Calcium: 10.1 mg/dL (ref 8.4–10.5)
Glucose, Bld: 143 mg/dL — ABNORMAL HIGH (ref 70–99)
Potassium: 4.5 mEq/L (ref 3.5–5.3)
Sodium: 141 mEq/L (ref 135–145)
Total Bilirubin: 0.5 mg/dL (ref 0.3–1.2)
Total Protein: 6.8 g/dL (ref 6.0–8.3)

## 2011-04-19 NOTE — Progress Notes (Signed)
Subjective:    Patient ID: Allison Mcdaniel, female    DOB: [birth year], 56 y.o.   MRN: [id number]  HPI 56 yo female with multiple medical problems here with abdominal complaints.  Hasn't felt well in 2 weeks.  Malaise, fleeting discomfort in abdomen.  Tired.  Has been dieting and eating better since January.  Has lost 15#.  Felt better initially but now bad.  Doing protein shakes.  Occasional nausea.  No constipation or diarrhea.  No fever.  No vomitting.  Pain not severe.   Some baseline discomfort - "awareness of discomfort".  Not sharp.  Vague and worse with certain movement occasionally.     Review of Systems Negative except as per HPI     Objective:   Physical Exam  Constitutional: Vital signs are normal. She appears well-developed and well-nourished. She is active.  Cardiovascular: Normal rate, regular rhythm, normal heart sounds and normal pulses.   Pulmonary/Chest: Effort normal and breath sounds normal.  Abdominal: Soft. Normal appearance and bowel sounds are normal. She exhibits no distension and no mass. There is no hepatosplenomegaly. There is tenderness. There is no rigidity, no rebound, no guarding, no CVA tenderness, no tenderness at McBurney's point and negative Murphy's sign. No hernia.  Neurological: She is alert.    Tenderness across lower abdomen, no discrete point tenderness.   Results for orders placed in visit on 04/19/11  POCT CBC      Component Value Range   WBC 7.1  4.6 - 10.2 (K/uL)   Lymph, poc 2.6  0.6 - 3.4    POC LYMPH PERCENT 36.2  10 - 50 (%L)   MID (cbc) 0.5  0 - 0.9    POC MID % 6.5  0 - 12 (%M)   POC Granulocyte 4.1  2 - 6.9    Granulocyte percent 57.3  37 - 80 (%G)   RBC 4.29  4.04 - 5.48 (M/uL)   Hemoglobin 12.8  12.2 - 16.2 (g/dL)   HCT, POC 13.2  44.0 - 47.9 (%)   MCV 92.4  80 - 97 (fL)   MCH, POC 29.8  27 - 31.2 (pg)   MCHC 32.3  31.8 - 35.4 (g/dL)   RDW, POC 10.2     Platelet Count, POC 308  142 - 424 (K/uL)   MPV 8.5  0 - 99.8 (fL)    POCT UA - MICROSCOPIC ONLY      Component Value Range   WBC, Ur, HPF, POC 0-1     RBC, urine, microscopic neg     Bacteria, U Microscopic trace     Mucus, UA neg     Epithelial cells, urine per micros 0-2     Crystals, Ur, HPF, POC neg     Casts, Ur, LPF, POC neg     Yeast, UA neg    POCT URINALYSIS DIPSTICK      Component Value Range   Color, UA yellow     Clarity, UA clear     Glucose, UA neg     Bilirubin, UA neg     Ketones, UA neg     Spec Grav, UA 1.015     Blood, UA neg     pH, UA 7.5     Protein, UA neg     Urobilinogen, UA 0.2     Nitrite, UA neg     Leukocytes, UA Negative     [facility] Primary radiology reading by Dr. Georgiana Shore: NAD    Assessment & Plan:  Malaise and vague abdominal discomfort.  Normal CBC, urine, and xray.  Await CMET to ensure kidney function stable and NAFLD stable.  If persists another 1-2 weeks, will do CT abd/pelvix.

## 2011-05-12 ENCOUNTER — Telehealth: Payer: Self-pay

## 2011-05-12 NOTE — Telephone Encounter (Signed)
As Dr. Hal Hope has not seen her for many months, I suggest she get an order from Dr. Arbie Cookey for this.  Alternatively, she could go to the [facility] on [address] and they can fit her with a good orthotic.  Otherwise I suggest she RTC to see Dr. Hal Hope.

## 2011-05-12 NOTE — Telephone Encounter (Signed)
PT STATES DR Hal Hope SUGGESTED SHE GET SOME ORTHODIC TO PUT IN HER SHOES, BUT SHE NEED AN ORDER SENT TO [facility] IN ORDER TO GET THEM PLEASE CALL PT AT [phone] OR HER CELL AT [phone]

## 2011-05-15 NOTE — Telephone Encounter (Signed)
Patient is well known to me.  Given her diabeties I am comfortable prescribing the orthotics.  Please call her and inquire how I deliver the Rx to [facility]. Is patient to pick up prescription or is there a fax # that we can directly fax the prescription to.

## 2011-05-16 NOTE — Telephone Encounter (Signed)
LMOM to call back

## 2011-05-18 NOTE — Telephone Encounter (Signed)
Spoke with pt, she has appt with Dr. Hal Hope tomorrow so she can just get RX for orthotics at that time

## 2011-05-19 ENCOUNTER — Ambulatory Visit (INDEPENDENT_AMBULATORY_CARE_PROVIDER_SITE_OTHER): Payer: BC Managed Care – PPO | Admitting: Family Medicine

## 2011-05-19 ENCOUNTER — Encounter: Payer: Self-pay | Admitting: Family Medicine

## 2011-05-19 VITALS — BP 118/77 | HR 56 | Temp 96.5°F | Resp 20 | Ht 68.5 in | Wt 242.0 lb

## 2011-05-19 DIAGNOSIS — E785 Hyperlipidemia, unspecified: Secondary | ICD-10-CM

## 2011-05-19 DIAGNOSIS — K760 Fatty (change of) liver, not elsewhere classified: Secondary | ICD-10-CM

## 2011-05-19 DIAGNOSIS — E119 Type 2 diabetes mellitus without complications: Secondary | ICD-10-CM

## 2011-05-19 DIAGNOSIS — G4733 Obstructive sleep apnea (adult) (pediatric): Secondary | ICD-10-CM

## 2011-05-19 DIAGNOSIS — F32A Depression, unspecified: Secondary | ICD-10-CM

## 2011-05-19 DIAGNOSIS — I10 Essential (primary) hypertension: Secondary | ICD-10-CM

## 2011-05-19 DIAGNOSIS — F329 Major depressive disorder, single episode, unspecified: Secondary | ICD-10-CM

## 2011-05-19 LAB — LIPID PANEL
Cholesterol: 213 mg/dL — ABNORMAL HIGH (ref 0–200)
HDL: 43 mg/dL (ref >39)
LDL Cholesterol: 128 mg/dL — ABNORMAL HIGH (ref 0–99)
Total CHOL/HDL Ratio: 5 Ratio
VLDL: 42 mg/dL — ABNORMAL HIGH (ref 0–40)

## 2011-05-19 LAB — POCT GLYCOSYLATED HEMOGLOBIN (HGB A1C): Hemoglobin A1C: 6.4

## 2011-05-19 MED ORDER — LISINOPRIL-HYDROCHLOROTHIAZIDE 10-12.5 MG PO TABS: 1.0000 | ORAL_TABLET | Freq: Every day | ORAL | Status: DC

## 2011-05-19 MED ORDER — METFORMIN HCL 500 MG PO TABS: 1000.0000 mg | ORAL_TABLET | Freq: Two times a day (BID) | ORAL | Status: DC

## 2011-05-19 MED ORDER — METOPROLOL TARTRATE 25 MG PO TABS: 25.0000 mg | ORAL_TABLET | Freq: Two times a day (BID) | ORAL | Status: DC

## 2011-05-19 MED ORDER — FENOFIBRATE 145 MG PO TABS: 145.0000 mg | ORAL_TABLET | Freq: Every day | ORAL | Status: DC

## 2011-05-19 NOTE — Progress Notes (Signed)
Subjective:    Patient ID: Allison Mcdaniel, female    DOB: 01-11-1956, 56 y.o.   MRN: [id number]  HPI  Patient presents in routine follow up of her multiple medical problems.  Had taken supplements to lose weight recently apparently they made her sick which resulted in an urgent care OV. Patient continues to inquire options for weight loss. " I just love to eat.I have so many triggers to over eat"   Hiking regularly  Compliant with medications; denies side effects. Review of Systems     Objective:   Physical Exam  Constitutional: She appears well-developed.  HENT:  Head: Normocephalic and atraumatic.  Neck: Neck supple. Carotid bruit is not present. No thyromegaly present.  Cardiovascular: Normal rate, regular rhythm, normal heart sounds and intact distal pulses.   Pulmonary/Chest: Effort normal and breath sounds normal.  Abdominal: Soft. Bowel sounds are normal. She exhibits no mass. There is no hepatosplenomegaly. There is no tenderness.  Musculoskeletal: Normal range of motion.  Neurological: She is alert.  Skin:       Multiple varicosities No LE lesions  Psychiatric: She has a normal mood and affect.     Results for orders placed in visit on 05/19/11  POCT GLYCOSYLATED HEMOGLOBIN (HGB A1C)      Component Value Range   Hemoglobin A1C 6.4         Assessment & Plan:   1. DM (diabetes mellitus)  metFORMIN (GLUCOPHAGE) 500 MG tablet, fenofibrate (TRICOR) 145 MG tablet, POCT glycosylated hemoglobin (Hb A1C)  2. HYPERTENSION  metoprolol tartrate (LOPRESSOR) 25 MG tablet, lisinopril-hydrochlorothiazide (PRINZIDE,ZESTORETIC) 10-12.5 MG per tablet  3. Hyperlipidemia  Lipid panel  4. OSA on CPAP    5. NAFLD (nonalcoholic fatty liver disease)  Lipid panel  6. Depression      Weight loss options discussed to include weight watchers, TOPS, and bariatric procedures. Rx for orthotics Rx for glucometer supplies

## 2011-05-22 ENCOUNTER — Other Ambulatory Visit: Payer: Self-pay | Admitting: Dermatology

## 2011-08-20 ENCOUNTER — Ambulatory Visit: Payer: BC Managed Care – PPO | Admitting: Family Medicine

## 2011-09-03 ENCOUNTER — Ambulatory Visit: Payer: Self-pay | Admitting: Obstetrics and Gynecology

## 2011-09-03 DIAGNOSIS — N643 Galactorrhea not associated with childbirth: Secondary | ICD-10-CM | POA: Insufficient documentation

## 2011-09-03 DIAGNOSIS — N858 Other specified noninflammatory disorders of uterus: Secondary | ICD-10-CM | POA: Insufficient documentation

## 2011-09-03 DIAGNOSIS — E663 Overweight: Secondary | ICD-10-CM | POA: Insufficient documentation

## 2011-09-03 DIAGNOSIS — IMO0001 Reserved for inherently not codable concepts without codable children: Secondary | ICD-10-CM | POA: Insufficient documentation

## 2011-09-03 DIAGNOSIS — N912 Amenorrhea, unspecified: Secondary | ICD-10-CM | POA: Insufficient documentation

## 2011-09-03 DIAGNOSIS — K635 Polyp of colon: Secondary | ICD-10-CM | POA: Insufficient documentation

## 2011-09-03 DIAGNOSIS — N939 Abnormal uterine and vaginal bleeding, unspecified: Secondary | ICD-10-CM | POA: Insufficient documentation

## 2011-09-09 ENCOUNTER — Encounter: Payer: Self-pay | Admitting: Obstetrics and Gynecology

## 2011-09-09 ENCOUNTER — Ambulatory Visit (INDEPENDENT_AMBULATORY_CARE_PROVIDER_SITE_OTHER): Payer: BC Managed Care – PPO | Admitting: Obstetrics and Gynecology

## 2011-09-09 VITALS — BP 122/66 | Ht 68.5 in | Wt 249.0 lb

## 2011-09-09 DIAGNOSIS — E663 Overweight: Secondary | ICD-10-CM

## 2011-09-09 DIAGNOSIS — N912 Amenorrhea, unspecified: Secondary | ICD-10-CM

## 2011-09-09 DIAGNOSIS — R922 Inconclusive mammogram: Secondary | ICD-10-CM

## 2011-09-09 NOTE — Progress Notes (Signed)
AEX  Last Pap: 07/20/2008 WNL: Yes Regular Periods:no S/P endometrial ablation Contraception: Post-Menpausal   Monthly Breast exam:no Tetanus<3yrs:yes Nl.Bladder Function:yes Daily BMs:yes Healthy Diet:yes Calcium:yes Mammogram:yes Date of Mammogram: 07/2011 Exercise:yes Have often Exercise: 2-3 times weekly  Seatbelt: yes Abuse at home: no Stressful work:no Sigmoid-colonoscopy: 2008 WNL Bone Density: Yes 07/2009 PCP: [facility] Change in PMH: None Change in [facility]: None  Subjective:    Allison Mcdaniel is a 56 y.o. female G0P0000 who presents for annual exam.  The patient has no complaints today. She asks questions about the benefit of bariatric surgery.  Has a new dx of Type II DM  The following portions of the patient's history were reviewed and updated as appropriate: allergies, current medications, past family history, past medical history, past social history, past surgical history and problem list.  Review of Systems Pertinent items are noted in HPI. Gastrointestinal:No change in bowel habits, no abdominal pain, no rectal bleeding Genitourinary:negative for dysuria, frequency, hematuria, nocturia and urinary incontinence    Objective:     BP 122/66  Ht 5' 8.5" (1.74 m)  Wt 249 lb (112.946 kg)  BMI 37.31 kg/m2  Weight:  Wt Readings from Last 1 Encounters:  09/09/11 249 lb (112.946 kg)     BMI: Body mass index is 37.31 kg/(m^2). General Appearance: Alert, appropriate appearance for age. No acute distress HEENT: Grossly normal Neck / Thyroid: Supple, no masses, nodes or enlargement Lungs: clear to auscultation bilaterally Back: No CVA tenderness Breast Exam: No masses or nodes.No dimpling, nipple retraction or discharge. Cardiovascular: Regular rate and rhythm. S1, S2, no murmur Gastrointestinal: Soft, non-tender, no masses or organomegaly Pelvic Exam: Vulva and vagina appear normal. Bimanual exam reveals normal uterus and adnexa. Exam limited  by body habitus Rectovaginal: normal rectal, no masses Lymphatic Exam: Non-palpable nodes in neck, clavicular, axillary, or inguinal regions  Skin: no rash or abnormalities Neurologic: Normal gait and speech, no tremor  Psychiatric: Alert and oriented, appropriate affect.    Urinalysis:Not done      Assessment:    Normal gyn exam    Plan:    All questions answered. Discussed healthy lifestyle modifications. Discussed implications of Dense breasts on MG.  Pt did not request further testing  Needs pap with HR HPV @[facility]  Follow-up:  for annual exam

## 2011-09-15 ENCOUNTER — Other Ambulatory Visit: Payer: Self-pay | Admitting: Family Medicine

## 2011-09-15 NOTE — Telephone Encounter (Signed)
Pt states she used to see dr Hal Hope and has an appt on sept. 4 w/dr mcpherson. Pt states she [facility] and they faxed requests to [place] for her metformin. i explained new refill procedure. She states she does have a refill available for the metformin but its supposed to be extended release and what was written is not extended release.  Please cal ptl: [phone]  Pharmacy: [facility]

## 2011-09-15 NOTE — Telephone Encounter (Signed)
One month rx sent in and patient notified.

## 2011-09-16 ENCOUNTER — Encounter: Payer: Self-pay | Admitting: Family Medicine

## 2011-10-01 ENCOUNTER — Encounter: Payer: Self-pay | Admitting: Family Medicine

## 2011-10-01 ENCOUNTER — Ambulatory Visit (INDEPENDENT_AMBULATORY_CARE_PROVIDER_SITE_OTHER): Payer: BC Managed Care – PPO | Admitting: Family Medicine

## 2011-10-01 VITALS — BP 130/90 | HR 63 | Temp 97.9°F | Resp 16 | Ht 68.0 in | Wt 244.4 lb

## 2011-10-01 DIAGNOSIS — I10 Essential (primary) hypertension: Secondary | ICD-10-CM

## 2011-10-01 DIAGNOSIS — E119 Type 2 diabetes mellitus without complications: Secondary | ICD-10-CM

## 2011-10-01 DIAGNOSIS — E785 Hyperlipidemia, unspecified: Secondary | ICD-10-CM

## 2011-10-01 LAB — COMPREHENSIVE METABOLIC PANEL
ALT: 39 U/L — ABNORMAL HIGH (ref 0–35)
AST: 28 U/L (ref 0–37)
CO2: 24 mEq/L (ref 19–32)
Calcium: 10.2 mg/dL (ref 8.4–10.5)
Chloride: 102 mEq/L (ref 96–112)
Creat: 0.96 mg/dL (ref 0.50–1.10)
Sodium: 139 mEq/L (ref 135–145)
Total Protein: 7.1 g/dL (ref 6.0–8.3)

## 2011-10-01 LAB — LIPID PANEL
Cholesterol: 209 mg/dL — ABNORMAL HIGH (ref 0–200)
Total CHOL/HDL Ratio: 4.4 Ratio
VLDL: 39 mg/dL (ref 0–40)

## 2011-10-01 LAB — POCT GLYCOSYLATED HEMOGLOBIN (HGB A1C): Hemoglobin A1C: 6.5

## 2011-10-01 MED ORDER — METOPROLOL TARTRATE 25 MG PO TABS: 25.0000 mg | ORAL_TABLET | Freq: Two times a day (BID) | ORAL | Status: DC

## 2011-10-01 MED ORDER — LISINOPRIL-HYDROCHLOROTHIAZIDE 10-12.5 MG PO TABS: 1.0000 | ORAL_TABLET | Freq: Every day | ORAL | Status: DC

## 2011-10-01 MED ORDER — METFORMIN HCL ER 500 MG PO TB24: 1000.0000 mg | ORAL_TABLET | Freq: Two times a day (BID) | ORAL | Status: DC

## 2011-10-01 MED ORDER — FENOFIBRATE 145 MG PO TABS: 145.0000 mg | ORAL_TABLET | Freq: Every day | ORAL | Status: DC

## 2011-10-01 NOTE — Patient Instructions (Signed)
See Dr. Audria Nine in 3-4 mo  Same meds  Go to the [facility] Weight Loss Surgery Meeting

## 2011-10-01 NOTE — Progress Notes (Signed)
S: Pt here to see Dr. Audria Nine who is out sick today.   Wants to discuss weight loss surgery.  We discussed, but I recommended going to the classes at [facility] Dr. Hal Hope was her doc.  Takes a lot of supplements.  Tried to avoid statins. ROS: no cp, breathing problems, GI or GU problems.  Has dry eyes.  ? Rosacia. Diet is variable  O; Obese.  Heent normal except red face c/w rosacia Chest cta. No bruits.  Heart RRR Abd. Soft.  EXT normal.  No edema.   Normal sensory  A;  DM\ Obesity\ Hyperlipidemia Rosacea  P; Alsey :Long classes on weight Same RX

## 2011-10-03 ENCOUNTER — Encounter: Payer: Self-pay | Admitting: Family Medicine

## 2011-12-31 ENCOUNTER — Encounter: Payer: Self-pay | Admitting: Family Medicine

## 2011-12-31 ENCOUNTER — Ambulatory Visit (INDEPENDENT_AMBULATORY_CARE_PROVIDER_SITE_OTHER): Payer: BC Managed Care – PPO | Admitting: Family Medicine

## 2011-12-31 VITALS — BP 120/78 | HR 60 | Temp 97.9°F | Resp 16 | Ht 68.0 in | Wt 246.8 lb

## 2011-12-31 DIAGNOSIS — I10 Essential (primary) hypertension: Secondary | ICD-10-CM

## 2011-12-31 DIAGNOSIS — E8881 Metabolic syndrome: Secondary | ICD-10-CM

## 2011-12-31 DIAGNOSIS — IMO0001 Reserved for inherently not codable concepts without codable children: Secondary | ICD-10-CM

## 2011-12-31 NOTE — Patient Instructions (Addendum)
Rosacea Rosacea is a long-term (chronic) condition that affects the skin of the face (cheeks, nose, brow, and chin) and sometimes the eyes. Rosacea causes the blood vessels near the surface of the skin to enlarge, resulting in redness. This condition usually begins after age 58. It occurs most often in light-skinned women. Without treatment, rosacea tends to get worse over time. There is no cure for rosacea, but treatment can help control your symptoms. CAUSES  The cause is unknown. It is thought that some people may inherit a tendency to develop rosacea. Certain triggers can make your rosacea worse, including:  Hot baths.  Exercise.  Sunlight.  Very hot or cold temperatures.  Hot or spicy foods and drinks.  Drinking alcohol.  Stress.  Taking blood pressure medicine.  Long-term use of topical steroids on the face. SYMPTOMS   Redness of the face.  Red bumps or pimples on the face.  Red, enlarged nose (rhinophyma).  Blushing easily.  Red lines on the skin.  Irritated or burning feeling in the eyes.  Swollen eyelids. DIAGNOSIS  Your caregiver can usually tell what is wrong by asking about your symptoms and performing a physical exam. TREATMENT  Avoiding triggers is an important part of treatment. You will also need to see a skin specialist (dermatologist) who can develop a treatment plan for you. The goals of treatment are to control your condition and to improve the appearance of your skin. It may take several weeks or months of treatment before you notice an improvement in your skin. Even after your skin improves, you will likely need to continue treatment to prevent your rosacea from coming back. Treatment methods may include:  Using sunscreen or sunblock daily to protect the skin.  Antibiotic medicine, such as metronidazole, applied directly to the skin.  Antibiotics taken by mouth. This is usually prescribed if you have eye problems from your rosacea.  Laser surgery  to improve the appearance of the skin. This surgery can reduce the appearance of red lines on the skin and can remove excess tissue from the nose to reduce its size. HOME CARE INSTRUCTIONS  Avoid things that seem to trigger your flare-ups.  If you are given antibiotics, take them as directed. Finish them even if you start to feel better.  Use a gentle facial cleanser that does not contain alcohol.  You may use a mild facial moisturizer.  Use a sunscreen or sunblock with SPF 30 or greater.  Wear a green-tinted foundation powder to conceal redness, if needed. Choose cosmetics that are noncomedogenic. This means they do not block your pores.  If your eyelids are affected, apply warm compresses to the eyelids. Do this up to 4 times a day or as directed by your caregiver. SEEK MEDICAL CARE IF:  Your skin problems get worse.  You feel depressed.  You lose your appetite.  You have trouble concentrating.  You have problems with your eyes, such as redness or itching. MAKE SURE YOU:  Understand these instructions.  Will watch your condition.  Will get help right away if you are not doing well or get worse. Document Released: 02/21/2004 Document Revised: 07/15/2011 Document Reviewed: 12/24/2010 [facility] Patient Information 2013 [place], [place].    I will call your pharmacy to check on refills and authorize refills as needed.

## 2011-12-31 NOTE — Progress Notes (Signed)
S:  This 56 y.o. Cauc female has Metabolic Syndrome; she was seen at [facility] in Sept for discussion about weight loss surgery (Dr. Alwyn Ren advised Weight loss/ Nutrition Management instruction at [facility]).  To date, she has not had any   counseling. Her current job makes it difficult to get any regular exercise; she does physical therapy in an inpatient setting. In the past, she had to drive up to 981 miles daily to get to clients. She has DJD involving knees and is s/p left knee  arthroscopy (currently without pain).  She does mention faint rash on face and wonders about definitive treatment foe rosacea (though she doesn't really think she needs it).  Re: DM- FSBS have been "running high"- 130-150. No hypoglycemia. No peripheral numbness or coldness. Re: HTN- pt does not check BP at home. She denies HA, CP or tightness, palpitations, edema, dizziness, weakness or syncope.   ROS: As per HPI.  O:  Filed Vitals:   12/31/11 0748  BP: 120/78  Pulse: 60  Temp: 97.9 F (36.6 C)  Resp: 16   GEN: In NAD; WN,WD. HENT: [place]/AT; EOMI w/ clear conj and sclerae. Otherwise normal. NECK: No TMG. COR: RRR. Distal pulses 2+/=. LUNGS: Normal resp rate and effort. EXT: Left knee w/ well healed scar. No edema. Some dryness of feet but no ulcerations. NEURO: A&O x 3; CNs intact. Nonfocal.    A/P: 1. Type II or unspecified type diabetes mellitus without mention of complication, uncontrolled  Pt does need to take advantage of Nutrition counseling though she states she has been successful w/ weight reduction in the past; she plans to get more active and modify nutrition.  2. Metabolic syndrome   Continue current medications.  3. HTN (hypertension)  Stable; no med changes.

## 2012-01-25 ENCOUNTER — Ambulatory Visit (INDEPENDENT_AMBULATORY_CARE_PROVIDER_SITE_OTHER): Payer: BC Managed Care – PPO | Admitting: Emergency Medicine

## 2012-01-25 VITALS — BP 132/82 | HR 76 | Temp 98.5°F | Resp 16 | Ht 68.5 in | Wt 250.0 lb

## 2012-01-25 DIAGNOSIS — L719 Rosacea, unspecified: Secondary | ICD-10-CM

## 2012-01-25 DIAGNOSIS — H1030 Unspecified acute conjunctivitis, unspecified eye: Secondary | ICD-10-CM

## 2012-01-25 MED ORDER — OFLOXACIN 0.3 % OP SOLN: 1.0000 [drp] | Freq: Four times a day (QID) | OPHTHALMIC | Status: DC

## 2012-01-25 MED ORDER — PIMECROLIMUS 1 % EX CREA: TOPICAL_CREAM | Freq: Two times a day (BID) | CUTANEOUS | Status: DC

## 2012-01-25 NOTE — Patient Instructions (Addendum)
Conjunctivitis Conjunctivitis is commonly called "pink eye." Conjunctivitis can be caused by bacterial or viral infection, allergies, or injuries. There is usually redness of the lining of the eye, itching, discomfort, and sometimes discharge. There may be deposits of matter along the eyelids. A viral infection usually causes a watery discharge, while a bacterial infection causes a yellowish, thick discharge. Pink eye is very contagious and spreads by direct contact. You may be given antibiotic eyedrops as part of your treatment. Before using your eye medicine, remove all drainage from the eye by washing gently with warm water and cotton balls. Continue to use the medication until you have awakened 2 mornings in a row without discharge from the eye. Do not rub your eye. This increases the irritation and helps spread infection. Use separate towels from other household members. Wash your hands with soap and water before and after touching your eyes. Use cold compresses to reduce pain and sunglasses to relieve irritation from light. Do not wear contact lenses or wear eye makeup until the infection is gone. SEEK MEDICAL CARE IF:   Your symptoms are not better after 3 days of treatment.  You have increased pain or trouble seeing.  The outer eyelids become very red or swollen. Document Released: 02/21/2004 Document Revised: 04/07/2011 Document Reviewed: 01/13/2005 ExitCare Patient Information 2013 [employer].  

## 2012-01-25 NOTE — Progress Notes (Signed)
Subjective:    Patient ID: Allison Mcdaniel, female    DOB: 01-09-1956, 56 y.o.   MRN: [id number]  HPI    Review of Systems     Objective:   Physical Exam        Assessment & Plan:    History of rosacea and inquired regarding treatment.  Suggested elidel cream  And will try that for short term

## 2012-01-25 NOTE — Progress Notes (Signed)
[facility] [address] [place] [address] [phone]  Date:  01/25/2012   Name:  Allison Mcdaniel   DOB:  01-11-1956   MRN:  [id number]  PCP:  [name], MD    Chief Complaint: Conjunctivitis   History of Present Illness:  Allison Mcdaniel is a 56 y.o. very pleasant female patient who presents with the following:  Treated a client for pink eye and she now has redness and gluing of both eyes worse on left. No eye pain or FB sensation.  Patient Active Problem List  Diagnosis  . HYPERTENSION  . PHLEBITIS  . DVT  . DM (diabetes mellitus)  . Hyperlipidemia  . OSA on CPAP  . NAFLD (nonalcoholic fatty liver disease)  . Depression  . Diverticula of colon  . Hypertension  . Diverticulosis  . Colon polyp  . Over weight  . Galactorrhea  . ASCUS (atypical squamous cells of undetermined significance) on Pap smear  . Abnormal uterine bleeding  . Atrophic endometrium  . Amenorrhea    Past Medical History  Diagnosis Date  . Depression   . Diabetes mellitus   . Hypertension   . Hyperlipidemia   . OSA on CPAP   . NAFLD (nonalcoholic fatty liver disease)   . Diverticulosis   . Colon polyp   . Over weight   . Galactorrhea   . ASCUS (atypical squamous cells of undetermined significance) on Pap smear   . Abnormal uterine bleeding   . Atrophic endometrium   . Amenorrhea   . Arthritis     Past Surgical History  Procedure Date  . Rotator cuff repair   . Knee arthroplasty   . Hysteroscopy   . Dilation and curettage of uterus   . Joint replacement     History  Substance Use Topics  . Smoking status: Never Smoker   . Smokeless tobacco: Never Used  . Alcohol Use: No    Family History  Problem Relation Age of Onset  . Hypertension Mother   . Hypertension Father   . Stroke Maternal Grandmother   . Heart disease Paternal Grandmother   . Heart disease Paternal Grandfather     Allergies  Allergen Reactions  . Bextra (Valdecoxib)   .  Penicillins     Medication list has been reviewed and updated.  Current Outpatient Prescriptions on File Prior to Visit  Medication Sig Dispense Refill  . aspirin 81 MG tablet Take 81 mg by mouth daily.      Marland Kitchen co-enzyme Q-10 30 MG capsule Take 30 mg by mouth 3 (three) times daily.      Marland Kitchen CRANBERRY PO Take by mouth.      . cycloSPORINE (RESTASIS) 0.05 % ophthalmic emulsion 1 drop 2 (two) times daily.      . fenofibrate (TRICOR) 145 MG tablet Take 1 tablet (145 mg total) by mouth daily.  90 tablet  2  . fish oil-omega-3 fatty acids 1000 MG capsule Take 2 g by mouth daily.      Marland Kitchen glucosamine-chondroitin 500-400 MG tablet Take 1 tablet by mouth 3 (three) times daily.      Marland Kitchen lisinopril-hydrochlorothiazide (PRINZIDE,ZESTORETIC) 10-12.5 MG per tablet Take 1 tablet by mouth daily.  90 tablet  2  . metFORMIN (GLUCOPHAGE-XR) 500 MG 24 hr tablet Take 2 tablets (1,000 mg total) by mouth 2 (two) times daily.  360 tablet  2  . metoprolol tartrate (LOPRESSOR) 25 MG tablet Take 1 tablet (25 mg total) by  mouth 2 (two) times daily.  180 tablet  2  . milk thistle 175 MG tablet Take 175 mg by mouth daily.      . Red Yeast Rice Extract (RED YEAST RICE PO) Take by mouth.        Review of Systems:  As per HPI, otherwise negative.    Physical Examination: Filed Vitals:   01/25/12 0841  BP: 132/82  Pulse: 76  Temp: 98.5 F (36.9 C)  Resp: 16   Filed Vitals:   01/25/12 0841  Height: 5' 8.5" (1.74 m)  Weight: 250 lb (113.399 kg)   Body mass index is 37.46 kg/(m^2). Ideal Body Weight: Weight in (lb) to have BMI = 25: 166.5    GEN: WDWN, NAD, Non-toxic, Alert & Oriented x 3 HEENT: Atraumatic, Normocephalic.  Ears and Nose: No external deformity. EXTR: No clubbing/cyanosis/edema NEURO: Normal gait.  PSYCH: Normally interactive. Conversant. Not depressed or anxious appearing.  Calm demeanor.  EYES:  Left conjunctiva injected with marginal exudate.  PRRERLA EOMI no hyphema or FB  Assessment and  Plan: Conjunctivitis Ofloxacin Follow up as needed  Carmelina Dane, MD

## 2012-01-25 NOTE — Addendum Note (Signed)
Addended by: Carmelina Dane on: 01/25/2012 09:43 AM   Modules accepted: Orders

## 2012-03-03 ENCOUNTER — Other Ambulatory Visit: Payer: Self-pay | Admitting: *Deleted

## 2012-03-03 DIAGNOSIS — E119 Type 2 diabetes mellitus without complications: Secondary | ICD-10-CM

## 2012-03-03 MED ORDER — FENOFIBRATE 145 MG PO TABS: 145.0000 mg | ORAL_TABLET | Freq: Every day | ORAL | Status: DC

## 2012-03-31 ENCOUNTER — Ambulatory Visit (INDEPENDENT_AMBULATORY_CARE_PROVIDER_SITE_OTHER): Payer: BC Managed Care – PPO | Admitting: Family Medicine

## 2012-03-31 ENCOUNTER — Encounter: Payer: Self-pay | Admitting: Family Medicine

## 2012-03-31 VITALS — BP 118/76 | HR 61 | Temp 97.6°F | Resp 16 | Ht 68.0 in | Wt 247.4 lb

## 2012-03-31 DIAGNOSIS — R209 Unspecified disturbances of skin sensation: Secondary | ICD-10-CM

## 2012-03-31 DIAGNOSIS — E785 Hyperlipidemia, unspecified: Secondary | ICD-10-CM

## 2012-03-31 DIAGNOSIS — R202 Paresthesia of skin: Secondary | ICD-10-CM

## 2012-03-31 DIAGNOSIS — I10 Essential (primary) hypertension: Secondary | ICD-10-CM

## 2012-03-31 DIAGNOSIS — IMO0001 Reserved for inherently not codable concepts without codable children: Secondary | ICD-10-CM

## 2012-03-31 LAB — LIPID PANEL
LDL Cholesterol: 108 mg/dL — ABNORMAL HIGH (ref 0–99)
Total CHOL/HDL Ratio: 4.5 Ratio
VLDL: 44 mg/dL — ABNORMAL HIGH (ref 0–40)

## 2012-03-31 LAB — BASIC METABOLIC PANEL
BUN: 16 mg/dL (ref 6–23)
CO2: 26 mEq/L (ref 19–32)
Chloride: 101 mEq/L (ref 96–112)
Glucose, Bld: 296 mg/dL — ABNORMAL HIGH (ref 70–99)
Potassium: 4.4 mEq/L (ref 3.5–5.3)
Sodium: 137 mEq/L (ref 135–145)

## 2012-03-31 MED ORDER — FENOFIBRATE 145 MG PO TABS: 145.0000 mg | ORAL_TABLET | Freq: Every day | ORAL | Status: DC

## 2012-03-31 MED ORDER — LISINOPRIL-HYDROCHLOROTHIAZIDE 10-12.5 MG PO TABS: 1.0000 | ORAL_TABLET | Freq: Every day | ORAL | Status: DC

## 2012-03-31 MED ORDER — METOPROLOL TARTRATE 25 MG PO TABS: 25.0000 mg | ORAL_TABLET | Freq: Two times a day (BID) | ORAL | Status: DC

## 2012-03-31 MED ORDER — METFORMIN HCL ER 500 MG PO TB24: 1000.0000 mg | ORAL_TABLET | Freq: Two times a day (BID) | ORAL | Status: DC

## 2012-03-31 NOTE — Progress Notes (Signed)
S: This 57 y.o. Cauc female has Type II DM ("sugars have been high"), HTN and Hyperlipidemia. Last  A1c= 6.5% in Sept 2013. 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 She has the insight to know what her challenges are; she is an "emotional eater" and does not make the wisest choices for snacking; this has resulted in weight gain. She is compliant w/ medications. Only new complaint is paresthesias in lower extremities. She has had procedure on lower legs to treat varicose veins and notes some tenderness behind L knee where vein is bruised.  ROS: Negative for fatigue, hypoglycemia, vision disturbance, CP or tightness, palpitations, SOB or DOE, cough, edema, myalgias, HA, dizziness, weakness, numbness or syncope.  O:  Filed Vitals:   03/31/12 0743  BP: 118/76                                         Weight is stable.  Pulse: 61  Temp: 97.6 F (36.4 C)  Resp: 16   GEN: In NAD; WN, WD obese female. HENT: [place]/AT; EOMI w/ clear conj/ sclerae. Oroph clear and moist. COR: RRR. No edema. LUNGS: Normal resp rate and effort. SKIN: Lower ext- superficial varicosities. No ecchymoses, erythema or pallor. MS: MAEs; no effusion or tenderness in major joints. No calf tenderness. NEURO: A&O x 3; CNs intact. Nonfocal.  Results for orders placed in visit on 03/31/12  POCT GLYCOSYLATED HEMOGLOBIN (HGB A1C)      Result Value Range   Hemoglobin A1C 8.6      A/P: Type II or unspecified type diabetes mellitus without mention of complication, uncontrolled - Plan: POCT glycosylated hemoglobin (Hb A1C), Lipid panel, metFORMIN (GLUCOPHAGE-XR) 500 MG 24 hr tablet  Pt declines additional of any other medication at this time; she plans to get aggressive re: weight loss and nutrition. 1st step- give up sodas.  HYPERTENSION - well controlled. Plan: Basic metabolic panel, lisinopril-hydrochlorothiazide (PRINZIDE,ZESTORETIC) 10-12.5 MG per tablet, metoprolol tartrate  (LOPRESSOR) 25 MG tablet  Paresthesia - Plan: Vitamin D, 25-hydroxy  Other and unspecified hyperlipidemia - Plan: fenofibrate (TRICOR) 145 MG tablet

## 2012-03-31 NOTE — Patient Instructions (Signed)
1800 Calorie Diet for Diabetes Meal Planning  The 1800 calorie diet is designed for eating up to 1800 calories each day. Following this diet and making healthy meal choices can help improve overall health. This diet controls blood sugar (glucose) levels and can also help lower blood pressure and cholesterol.  SERVING SIZES  Measuring foods and serving sizes helps to make sure you are getting the right amount of food. The list below tells how big or small some common serving sizes are:   1 oz.........4 stacked dice.   3 oz.........Deck of cards.   1 tsp........Tip of little finger.   1 tbs........Thumb.   2 tbs........Golf ball.    cup.......Half of a fist.   1 cup........A fist.  GUIDELINES FOR CHOOSING FOODS  The goal of this diet is to eat a variety of foods and limit calories to 1800 each day. This can be done by choosing foods that are low in calories and fat. The diet also suggests eating small amounts of food frequently. Doing this helps control your blood glucose levels so they do not get too high or too low. Each meal or snack may include a protein food source to help you feel more satisfied and to stabilize your blood glucose. Try to eat about the same amount of food around the same time each day. This includes weekend days, travel days, and days off work. Space your meals about 4 to 5 hours apart and add a snack between them if you wish.   For example, a daily food plan could include breakfast, a morning snack, lunch, dinner, and an evening snack. Healthy meals and snacks include whole grains, vegetables, fruits, lean meats, poultry, fish, and dairy products. As you plan your meals, select a variety of foods. Choose from the bread and starch, vegetable, fruit, dairy, and meat/protein groups. Examples of foods from each group and their suggested serving sizes are listed below. Use measuring cups and spoons to become familiar with what a healthy portion looks like.  Bread and Starch   Each serving equals 15 grams of carbohydrates.   1 slice bread.    bagel.    cup cold cereal (unsweetened).    cup hot cereal or mashed potatoes.   1 small potato (size of a computer mouse).    cup cooked pasta or rice.    English muffin.   1 cup broth-based soup.   3 cups of popcorn.   4 to 6 whole-wheat crackers.    cup cooked beans, peas, or corn.  Vegetable  Each serving equals 5 grams of carbohydrates.    cup cooked vegetables.   1 cup raw vegetables.    cup tomato or vegetable juice.  Fruit  Each serving equals 15 grams of carbohydrates.   1 small apple or orange.   1 cup watermelon or strawberries.    cup applesauce (no sugar added).   2 tbs raisins.    banana.    cup canned fruit, packed in water, its own juice, or sweetened with a sugar substitute.    cup unsweetened fruit juice.  Dairy  Each serving equals 12 to 15 grams of carbohydrates.   1 cup fat-free milk.   6 oz artificially sweetened yogurt or plain yogurt.   1 cup low-fat buttermilk.   1 cup soy milk.   1 cup almond milk.  Meat/Protein   1 large egg.   2 to 3 oz meat, poultry, or fish.    cup low-fat cottage cheese.     1 tbs peanut butter.   1 oz low-fat cheese.    cup tuna in water.    cup tofu.  Fat   1 tsp oil.   1 tsp trans-fat-free margarine.   1 tsp butter.   1 tsp mayonnaise.   2 tbs avocado.   1 tbs salad dressing.   1 tbs cream cheese.   2 tbs sour cream.  SAMPLE 1800 CALORIE DIET PLAN  Breakfast    cup unsweetened cereal (1 carb serving).   1 cup fat-free milk (1 carb serving).   1 slice whole-wheat toast (1 carb serving).    small banana (1 carb serving).   1 scrambled egg.   1 tsp trans-fat-free margarine.  Lunch   Tuna sandwich.   2 slices whole-wheat bread (2 carb servings).    cup canned tuna in water, drained.   1 tbs reduced fat mayonnaise.   1 stalk celery, chopped.   2 slices tomato.   1 lettuce leaf.   1 cup carrot sticks.    24 to 30 seedless grapes (2 carb servings).   6 oz light yogurt (1 carb serving).  Afternoon Snack   3 graham cracker squares (1 carb serving).   Fat-free milk, 1 cup (1 carb serving).   1 tbs peanut butter.  Dinner   3 oz salmon, broiled with 1 tsp oil.   1 cup mashed potatoes (2 carb servings) with 1 tsp trans-fat-free margarine.   1 cup fresh or frozen green beans.   1 cup steamed asparagus.   1 cup fat-free milk (1 carb serving).  Evening Snack   3 cups air-popped popcorn (1 carb serving).   2 tbs parmesan cheese sprinkled on top.  MEAL PLAN  Use this worksheet to help you make a daily meal plan based on the 1800 calorie diet suggestions. If you are using this plan to help you control your blood glucose, you may interchange carbohydrate-containing foods (dairy, starches, and fruits). Select a variety of fresh foods of varying colors and flavors. The total amount of carbohydrate in your meals or snacks is more important than making sure you include all of the food groups every time you eat. Choose from the following foods to build your day's meals:   8 Starches.   4 Vegetables.   3 Fruits.   2 Dairy.   6 to 7 oz Meat/Protein.   Up to 4 Fats.  Your dietician can use this worksheet to help you decide how many servings and which types of foods are right for you.  BREAKFAST  Food Group and Servings / Food Choice  Starch ________________________________________________________  Dairy _________________________________________________________  Fruit _________________________________________________________  Meat/Protein __________________________________________________  Fat ___________________________________________________________  LUNCH  Food Group and Servings / Food Choice  Starch ________________________________________________________  Meat/Protein __________________________________________________  Vegetable _____________________________________________________   Fruit _________________________________________________________  Dairy _________________________________________________________  Fat ___________________________________________________________  AFTERNOON SNACK  Food Group and Servings / Food Choice  Starch ________________________________________________________  Meat/Protein __________________________________________________  Fruit __________________________________________________________  Dairy _________________________________________________________  DINNER  Food Group and Servings / Food Choice  Starch _________________________________________________________  Meat/Protein ___________________________________________________  Dairy __________________________________________________________  Vegetable ______________________________________________________  Fruit ___________________________________________________________  Fat ____________________________________________________________  EVENING SNACK  Food Group and Servings / Food Choice  Fruit __________________________________________________________  Meat/Protein ___________________________________________________  Dairy __________________________________________________________  Starch _________________________________________________________  DAILY TOTALS  Starch ____________________________  Vegetable _________________________  Fruit _____________________________  Dairy _____________________________  Meat/Protein______________________  Fat _______________________________  Document Released: 08/05/2004 Document Revised: 04/07/2011 Document Reviewed: 11/29/2010  ExitCare Patient Information 2013   [employer].

## 2012-04-01 LAB — VITAMIN D 25 HYDROXY (VIT D DEFICIENCY, FRACTURES): Vit D, 25-Hydroxy: 30 ng/mL (ref 30–89)

## 2012-04-03 NOTE — Progress Notes (Signed)
Quick Note:  Please contact pt and advise that the following labs are abnormal...  Blood sugar elevated but this is not a surprise. Continue current medications and improve nutrition and increase activity level. Lipid profile is somewhat improved; above advise can help improve these numbers. Getting blood sugar under control will help bring down triglycerides. Continue current medications. Vitamin D level is just barely normal; daily intake needs to be at least 1000-2000 IU daily. If you are not taking a supplement; start with 1000 IU daily. Some daily sun exposure is helpful also.   Copy to pt. ______

## 2012-06-05 ENCOUNTER — Ambulatory Visit (INDEPENDENT_AMBULATORY_CARE_PROVIDER_SITE_OTHER): Payer: BC Managed Care – PPO | Admitting: Internal Medicine

## 2012-06-05 VITALS — BP 110/70 | HR 74 | Temp 98.6°F | Resp 18 | Wt 243.0 lb

## 2012-06-05 DIAGNOSIS — Z719 Counseling, unspecified: Secondary | ICD-10-CM

## 2012-06-05 DIAGNOSIS — IMO0001 Reserved for inherently not codable concepts without codable children: Secondary | ICD-10-CM

## 2012-06-05 DIAGNOSIS — E119 Type 2 diabetes mellitus without complications: Secondary | ICD-10-CM

## 2012-06-05 NOTE — Progress Notes (Signed)
Subjective:    Patient ID: Allison Mcdaniel, female    DOB: 1955/06/18, 57 y.o.   MRN: [id number]  HPI Diabetes improving, exercising more, needs form for work, ppd neg. Feels good.   Review of Systems unchanged    Objective:   Physical Exam  Vitals reviewed. Constitutional: She is oriented to person, place, and time. She appears well-developed and well-nourished.  Eyes: EOM are normal. No scleral icterus.  Neck: Normal range of motion.  Pulmonary/Chest: Effort normal.  Musculoskeletal: Normal range of motion.  Neurological: She is alert and oriented to person, place, and time. She exhibits normal muscle tone. Coordination normal.  Psychiatric: She has a normal mood and affect.          Assessment & Plan:  Counsel/NIDDM/Med Rev Form filled in

## 2012-08-04 ENCOUNTER — Encounter: Payer: Self-pay | Admitting: Family Medicine

## 2012-08-04 ENCOUNTER — Ambulatory Visit (INDEPENDENT_AMBULATORY_CARE_PROVIDER_SITE_OTHER): Payer: BC Managed Care – PPO | Admitting: Family Medicine

## 2012-08-04 VITALS — BP 120/70 | HR 61 | Temp 98.2°F | Resp 16 | Ht 68.0 in | Wt 238.0 lb

## 2012-08-04 DIAGNOSIS — E785 Hyperlipidemia, unspecified: Secondary | ICD-10-CM

## 2012-08-04 DIAGNOSIS — IMO0001 Reserved for inherently not codable concepts without codable children: Secondary | ICD-10-CM

## 2012-08-04 DIAGNOSIS — Z0000 Encounter for general adult medical examination without abnormal findings: Secondary | ICD-10-CM

## 2012-08-04 DIAGNOSIS — E1169 Type 2 diabetes mellitus with other specified complication: Secondary | ICD-10-CM

## 2012-08-04 DIAGNOSIS — Z23 Encounter for immunization: Secondary | ICD-10-CM

## 2012-08-04 LAB — COMPREHENSIVE METABOLIC PANEL
ALT: 42 U/L — ABNORMAL HIGH (ref 0–35)
AST: 25 U/L (ref 0–37)
Alkaline Phosphatase: 89 U/L (ref 39–117)
BUN: 14 mg/dL (ref 6–23)
Chloride: 98 mEq/L (ref 96–112)
Creat: 0.86 mg/dL (ref 0.50–1.10)
Total Bilirubin: 0.5 mg/dL (ref 0.3–1.2)

## 2012-08-04 LAB — LIPID PANEL
HDL: 39 mg/dL — ABNORMAL LOW (ref >39)
Total CHOL/HDL Ratio: 4.5 Ratio
VLDL: 40 mg/dL (ref 0–40)

## 2012-08-04 LAB — TSH: TSH: 1.335 u[IU]/mL (ref 0.350–4.500)

## 2012-08-04 LAB — POCT URINALYSIS DIPSTICK
Ketones, UA: NEGATIVE
Leukocytes, UA: NEGATIVE
Nitrite, UA: NEGATIVE
Protein, UA: NEGATIVE
Urobilinogen, UA: 0.2

## 2012-08-04 LAB — POCT GLYCOSYLATED HEMOGLOBIN (HGB A1C): Hemoglobin A1C: 9.8

## 2012-08-04 MED ORDER — GLIMEPIRIDE 4 MG PO TABS: ORAL_TABLET | ORAL | Status: DC

## 2012-08-04 NOTE — Progress Notes (Signed)
Subjective:    Patient ID: Allison Mcdaniel, female    DOB: Oct 20, 1955, 57 y.o.   MRN: [id number]  HPI  This 57 y.o. Cauc female is here for CPE; she sees Dr. Dierdre Forth for GYN care.   Pt has Type II DM, HTN and Dyslipidemia and is compliant with medications w/o report of  adverse effects.   HCM: Vision eval w/ Dr. Sharlot Gowda- July 2014.            PAP- per GYN.            MMG- per GYN; due in July 2014.            CRS- 2008 (normal- few polyps)  PMHx, Soc Hx and Fam Hx reviewed.   Review of Systems  Constitutional: Negative.   HENT: Negative.   Eyes: Positive for redness.  Respiratory: Negative.   Cardiovascular: Positive for leg swelling.  Gastrointestinal: Negative.   Endocrine: Negative.   Genitourinary: Negative.   Musculoskeletal: Negative.   Skin: Negative.   Allergic/Immunologic: Negative.   Neurological: Negative.   Hematological: Negative.   Psychiatric/Behavioral: Negative.        Objective:   Physical Exam  Nursing note and vitals reviewed. Constitutional: She is oriented to person, place, and time. She appears well-developed and well-nourished. No distress.  HENT:  Head: Normocephalic and atraumatic.  Right Ear: Hearing, external ear and ear canal normal. Tympanic membrane is scarred.  Left Ear: Hearing, external ear and ear canal normal. Tympanic membrane is scarred.  Nose: Nose normal. No mucosal edema, nasal deformity or septal deviation.  Mouth/Throat: Uvula is midline, oropharynx is clear and moist and mucous membranes are normal. She has dentures. No oral lesions. Abnormal dentition.  Eyes: Conjunctivae, EOM and lids are normal. Pupils are equal, round, and reactive to light. No scleral icterus.  Neck: Normal range of motion. Neck supple. No JVD present. No thyromegaly present.  Cardiovascular: Normal rate, regular rhythm, normal heart sounds and intact distal pulses.  Exam reveals no gallop and no friction rub.   No murmur heard. Pulmonary/Chest:  Effort normal and breath sounds normal. No respiratory distress. She has no wheezes. She has no rales. Right breast exhibits no inverted nipple, no mass, no nipple discharge, no skin change and no tenderness. Left breast exhibits no inverted nipple, no mass, no nipple discharge, no skin change and no tenderness. Breasts are symmetrical.  Abdominal: Soft. Bowel sounds are normal. She exhibits no distension, no abdominal bruit, no pulsatile midline mass and no mass. There is no hepatosplenomegaly. There is no tenderness. There is no guarding and no CVA tenderness. No hernia.  Genitourinary:  Deferred.  Musculoskeletal: Normal range of motion. She exhibits no edema and no tenderness.  Back- posterior R deltoid atrophy w/o tenderness.  Lymphadenopathy:    She has no cervical adenopathy.  Neurological: She is alert and oriented to person, place, and time. She has normal reflexes. No cranial nerve deficit. She exhibits normal muscle tone. Coordination normal.  Skin: Skin is warm and dry. No rash noted. No erythema. No pallor.  Psychiatric: She has a normal mood and affect. Her behavior is normal. Judgment and thought content normal.    ECG: NSR; nonspecific ST segment changes. No ectopy.   Results for orders placed in visit on 08/04/12  POCT URINALYSIS DIPSTICK      Result Value Range   Color, UA yellow     Clarity, UA clear     Glucose, UA 500  Bilirubin, UA neg     Ketones, UA neg     Spec Grav, UA 1.020     Blood, UA neg     pH, UA 7.0     Protein, UA neg     Urobilinogen, UA 0.2     Nitrite, UA neg     Leukocytes, UA Negative    POCT GLYCOSYLATED HEMOGLOBIN (HGB A1C)      Result Value Range   Hemoglobin A1C 9.8         Assessment & Plan:  Routine general medical examination at a health care facility - Plan: POCT urinalysis dipstick, EKG 12-Lead, TSH  Type II or unspecified type diabetes mellitus without mention of complication, uncontrolled - Plan: POCT glycosylated  hemoglobin (Hb A1C), Comprehensive metabolic panel  Dyslipidemia associated with type 2 diabetes mellitus - Plan: Lipid panel  Need for prophylactic vaccination against Streptococcus pneumoniae (pneumococcus) - Plan: Pneumococcal polysaccharide vaccine 23-valent greater than or equal to 2yo subcutaneous/IM   Meds ordered this encounter  Medications         . glimepiride (AMARYL) 4 MG tablet    Sig: Take 1/2 tablet twice daily before main meals.    Dispense:  30 tablet    Refill:  5

## 2012-08-04 NOTE — Patient Instructions (Addendum)
Keeping You Healthy  Get These Tests  Blood Pressure- Have your blood pressure checked by your healthcare provider at least once a year.  Normal blood pressure is 120/80.  Weight- Have your body mass index (BMI) calculated to screen for obesity.  BMI is a measure of body fat based on height and weight.  You can calculate your own BMI at https://www.west-esparza.com/  Cholesterol- Have your cholesterol checked every year.  Diabetes- Have your blood sugar checked every year if you have high blood pressure, high cholesterol, a family history of diabetes or if you are overweight.  Pap Smear- Have a pap smear every 1 to 3 years if you have been sexually active.  If you are older than 65 and recent pap smears have been normal you may not need additional pap smears.  In addition, if you have had a hysterectomy  For benign disease additional pap smears are not necessary.  Mammogram-Yearly mammograms are essential for early detection of breast cancer  Screening for Colon Cancer- Colonoscopy starting at age 69. Screening may begin sooner depending on your family history and other health conditions.  Follow up colonoscopy as directed by your Gastroenterologist.  Screening for Osteoporosis- Screening begins at age 30 with bone density scanning, sooner if you are at higher risk for developing Osteoporosis.  Get these medicines  Calcium with Vitamin D- Your body requires 1200-1500 mg of Calcium a day and [phone] IU of Vitamin D a day.  You can only absorb 500 mg of Calcium at a time therefore Calcium must be taken in 2 or 3 separate doses throughout the day.  Hormones- Hormone therapy has been associated with increased risk for certain cancers and heart disease.  Talk to your healthcare provider about if you need relief from menopausal symptoms.  Aspirin- Ask your healthcare provider about taking Aspirin to prevent Heart Disease and Stroke.  Get these Immuniztions  Flu shot- Every fall  Pneumonia  shot- Once after the age of 59; if you are younger ask your healthcare provider if you need a pneumonia shot. You received this vaccine today.  Tetanus- Every ten years.  Tdap was given in 2006; next Tetanus is due in 2016.  Zostavax- Once after the age of 59 to prevent shingles.  Take these steps  Don't smoke- Your healthcare provider can help you quit. For tips on how to quit, ask your healthcare provider or go to www.smokefree.gov or call 1-800 QUIT-NOW.  Be physically active- Exercise 5 days a week for a minimum of 30 minutes.  If you are not already physically active, start slow and gradually work up to 30 minutes of moderate physical activity.  Try walking, dancing, bike riding, swimming, etc.  Eat a healthy diet- Eat a variety of healthy foods such as fruits, vegetables, whole grains, low fat milk, low fat cheeses, yogurt, lean meats, chicken, fish, eggs, dried beans, tofu, etc.  For more information go to www.thenutritionsource.org  Dental visit- Brush and floss teeth twice daily; visit your dentist twice a year.  Eye exam- Visit your Optometrist or Ophthalmologist yearly.  Drink alcohol in moderation- Limit alcohol intake to one drink or less a day.  Never drink and drive.  Depression- Your emotional health is as important as your physical health.  If you're feeling down or losing interest in things you normally enjoy, please talk to your healthcare provider.  Seat Belts- can save your life; always wear one  Smoke/Carbon Monoxide detectors- These detectors need to be installed on the  appropriate level of your home.  Replace batteries at least once a year.  Violence- If anyone is threatening or hurting you, please tell your healthcare provider.  Living Will/ Health care power of attorney- Discuss with your healthcare provider and family.     Exercise to Lose Weight Exercise and a healthy diet may help you lose weight. Your doctor may suggest specific exercises. EXERCISE  IDEAS AND TIPS  Choose low-cost things you enjoy doing, such as walking, bicycling, or exercising to workout videos.  Take stairs instead of the elevator.  Walk during your lunch break.  Park your car further away from work or school.  Go to a gym or an exercise class.  Start with 5 to 10 minutes of exercise each day. Build up to 30 minutes of exercise 4 to 6 days a week.  Wear shoes with good support and comfortable clothes.  Stretch before and after working out.  Work out until you breathe harder and your heart beats faster.  Drink extra water when you exercise.  Do not do so much that you hurt yourself, feel dizzy, or get very short of breath. Exercises that burn about 150 calories:  Running 1  miles in 15 minutes.  Playing volleyball for 45 to 60 minutes.  Washing and waxing a car for 45 to 60 minutes.  Playing touch football for 45 minutes.  Walking 1  miles in 35 minutes.  Pushing a stroller 1  miles in 30 minutes.  Playing basketball for 30 minutes.  Raking leaves for 30 minutes.  Bicycling 5 miles in 30 minutes.  Walking 2 miles in 30 minutes.  Dancing for 30 minutes.  Shoveling snow for 15 minutes.  Swimming laps for 20 minutes.  Walking up stairs for 15 minutes.  Bicycling 4 miles in 15 minutes.  Gardening for 30 to 45 minutes.  Jumping rope for 15 minutes.  Washing windows or floors for 45 to 60 minutes. Document Released: 02/15/2010 Document Revised: 04/07/2011 Document Reviewed: 02/15/2010 [facility] Patient Information 2014 [place], [place].   New Diabetes medication- Glimepiride 4 mg tablets  Take 1/2 tablets twice a day before main meals.  Continue taking all other medications. Continue exercise program and nutrition modfiications. I will see you again in 3 months.  Take the prescription to [facility] or other larger pharmacy to get this medication at very low cost.

## 2012-08-05 NOTE — Progress Notes (Signed)
Quick Note:  Please contact pt and advise that the following labs are abnormal... Blood sugar is elevated as expected. Kidney and liver function tests are normal. Total and LDL cholesterol numbers are good; HDL ("good") cholesterol is lower than 4 months ago ; it needs to be above 39. Triglycerides are better but still above 150. Your heart disease risk remains "average". Better control of Diabetes and weight loss will decrease your heart disease risk.  Thyroid test is normal.  Copy to pt. ______

## 2012-10-01 ENCOUNTER — Telehealth: Payer: Self-pay

## 2012-10-01 DIAGNOSIS — G4733 Obstructive sleep apnea (adult) (pediatric): Secondary | ICD-10-CM

## 2012-10-01 NOTE — Telephone Encounter (Signed)
Pt states that her the barrin's on her CPAP machine are worn out on and would like another rx sent to [facility]. [facility]# [phone]

## 2012-10-02 NOTE — Telephone Encounter (Signed)
I do not understand what is worn out on her CPAP machine. Please contact [facility] to assess what she needs and we will order that.

## 2012-10-04 NOTE — Telephone Encounter (Signed)
Can you have someone check her CPAP, patient indicates it is worn out, it may need to be repaired or replaced. Order put in for this.

## 2012-10-05 ENCOUNTER — Telehealth: Payer: Self-pay | Admitting: Radiology

## 2012-10-05 NOTE — Telephone Encounter (Signed)
31001 MR , Will pull chart to see if we have copy of sleep study.

## 2012-10-05 NOTE — Telephone Encounter (Signed)
Message copied by Caffie Damme on Tue Oct 05, 2012 12:14 PM ------      Message from: Theador Hawthorne      Created: Tue Oct 05, 2012 10:45 AM      Regarding: replacment cpap       I have looked into whether or not this patient will qualify for a new cpap and she will.            We will need the following      -copy of diagnostic sleep study (we have a copy of the titration but not the very first study that was done.  I do not see this in media tab)      -a recent office visit note that discusses her use and benefit of cpap      -new referral for "replacement cpap with pressure setting"  She was put on a pressure of 9cm back in 2006'                  Thanks      Betsy ------

## 2012-10-07 ENCOUNTER — Encounter: Payer: Self-pay | Admitting: Family Medicine

## 2012-10-19 ENCOUNTER — Telehealth: Payer: Self-pay | Admitting: Radiology

## 2012-10-19 NOTE — Telephone Encounter (Signed)
Message copied by Caffie Damme on Tue Oct 19, 2012  4:39 PM ------      Message from: Theador Hawthorne      Created: Thu Oct 14, 2012 11:10 AM      Regarding: RE: cpap       Last items we need are:      Rx w/pressure setting      Office visit  note stating use and benefit of current therapy            Thanks      Tamela Oddi            ----- Message -----         From: Caffie Damme, Rad Tech         Sent: 10/12/2012   3:51 PM           To: Theador Hawthorne      Subject: RE: cpap                                                 Yes, I have finally located the sleep study. I faxed it to [phone]. Do you need anything else?      ----- Message -----         From: Theador Hawthorne         Sent: 10/12/2012   9:39 AM           To: Caffie Damme, Rad Tech      Subject: cpap                                                     Hey Amy      I was just following up to see if you all were able to get any of the items listed in the most recent telephone note in the encounters.            Thanks      [employer]             ------

## 2012-10-19 NOTE — Telephone Encounter (Signed)
Do you want to add these items [facility] needs? Or do you want me to send the order to another facility?

## 2012-10-20 NOTE — Telephone Encounter (Signed)
I do not see where any of my notes specifically address this pt's sleep apnea/OSA. I even looked through Dr. Wendee Copp notes; it only mentions OSA diagnosis but no specifics about CPAP use or benefit.  Does pt have any record of how frequently she uses CPAP? Original order for CPAP from where?

## 2012-10-20 NOTE — Telephone Encounter (Signed)
Thanks. Called patient . I have not been able to locate the sleep study the patient indicates her CPAP has worn out. I left message for patient to call me back. She will need office visit to document the sleep apnea and then she will need a sleep study.

## 2012-10-20 NOTE — Telephone Encounter (Signed)
She states [facility] does have the sleep study. She states she thinks the pressure setting is 11. She states her CPAP is worn out. She states she will come back on Oct 8th, and you can do a face to face eval then so she can get the machine, but she would like it before then. She will get [place] a copy of the sleep study.

## 2012-10-21 ENCOUNTER — Telehealth: Payer: Self-pay | Admitting: Radiology

## 2012-10-21 DIAGNOSIS — G4733 Obstructive sleep apnea (adult) (pediatric): Secondary | ICD-10-CM

## 2012-10-21 NOTE — Telephone Encounter (Signed)
Spoke to patient again, she states the pressure setting is 11. I sent this information to [facility], she states they have this but it has to be on the order. I have told her we can not add the face to face eval to her previous visit since it was not discussed, so when you see her on October 8th please document in her note her CPAP is worn out, and she needs replacement. We do not have the old sleep study, but patient states [facility] does have this.

## 2012-10-21 NOTE — Telephone Encounter (Signed)
So advised. Will address that specifically on Oct 8th. Thank you. Appreciate your work on this.

## 2012-10-21 NOTE — Telephone Encounter (Signed)
Patient will have face to face in October for the CPAP she indicates the previous sleep study is with [facility] already, and her setting is 5.

## 2012-11-03 ENCOUNTER — Encounter: Payer: Self-pay | Admitting: Family Medicine

## 2012-11-03 ENCOUNTER — Ambulatory Visit (INDEPENDENT_AMBULATORY_CARE_PROVIDER_SITE_OTHER): Payer: BC Managed Care – PPO | Admitting: Family Medicine

## 2012-11-03 VITALS — BP 112/71 | HR 62 | Temp 97.6°F | Resp 16 | Ht 68.0 in | Wt 247.0 lb

## 2012-11-03 DIAGNOSIS — E785 Hyperlipidemia, unspecified: Secondary | ICD-10-CM

## 2012-11-03 DIAGNOSIS — I10 Essential (primary) hypertension: Secondary | ICD-10-CM

## 2012-11-03 DIAGNOSIS — G4733 Obstructive sleep apnea (adult) (pediatric): Secondary | ICD-10-CM

## 2012-11-03 DIAGNOSIS — Z23 Encounter for immunization: Secondary | ICD-10-CM

## 2012-11-03 DIAGNOSIS — E119 Type 2 diabetes mellitus without complications: Secondary | ICD-10-CM

## 2012-11-03 DIAGNOSIS — IMO0001 Reserved for inherently not codable concepts without codable children: Secondary | ICD-10-CM

## 2012-11-03 LAB — T3, FREE: T3, Free: 3.3 pg/mL (ref 2.3–4.2)

## 2012-11-03 LAB — POCT GLYCOSYLATED HEMOGLOBIN (HGB A1C): Hemoglobin A1C: 7.9

## 2012-11-03 LAB — BASIC METABOLIC PANEL
Potassium: 4.5 mEq/L (ref 3.5–5.3)
Sodium: 137 mEq/L (ref 135–145)

## 2012-11-03 LAB — LIPID PANEL
Total CHOL/HDL Ratio: 4.1 Ratio
VLDL: 26 mg/dL (ref 0–40)

## 2012-11-03 NOTE — Progress Notes (Signed)
S:  This 57 y.o. Cauc female is here for DM follow-up; pt has been compliant w/ medications but continues to struggle w/ nutrition. New medication added at last visit- Glimepiride. She drinks sodas and eats more carbs than is advised. She does not report any FSBS. She is more active because she has a new job w/ [employer] in [place], [place]; this new position allows her to be more active and is less stressful. Pt would like to discontinue medications eventually but she understands that significant weight loss is the key to medication reduction.  HTN- Pt has well controlled BP w/ excellent medication compliance. No adverse medication effects reported. Pt has not had diaphoresis, CP or tightness, palpitations, SOB or DOE, HA, dizziness, numbness, weakness or syncope.  OSA- pt reports using CPAP every night and feels better. She is benefiting from use of the apparatus and has less daytime sleepiness. Her back pain has subsided which she attributes to less "tossing and turning".  PMHx, Soc Hx and Problem List reviewed. Medications reconciled.  ROS: As per HPI.  O: Filed Vitals:   11/03/12 0752  BP: 112/71  Pulse: 62  Temp: 97.6 F (36.4 C)  Resp: 16   GEN: In NAD; WN,WD. HENT: [place]/AT; EOMI w/ clear conj/sclerae. Otherwise unremarkable. COR: RRR. LUNGS: Normal resp rate and effort. SKIN: W&D; intact w/o erythema or pallor. NEURO: A&O x 3; CNs intact. Nonfocal.  Results for orders placed in visit on 11/03/12  POCT GLYCOSYLATED HEMOGLOBIN (HGB A1C)      Result Value Range   Hemoglobin A1C 7.9      A/P:  DM (diabetes mellitus) - Improved; continue current medications and encouraged weight loss. Plan: Basic metabolic panel, POCT glycosylated hemoglobin (Hb A1C)  HYPERTENSION - Stable and controlled; continue current medications.   Plan: T3, Free  Hyperlipidemia - Plan: Lipid panel  OSA on CPAP- Stable and pt improved with nightly use of apparatus.  RTC in 4 months or sooner prn.   Pt  given a printed copy of this visit to give to [facility] (re: CPAP usage and treatment of OSA).

## 2012-11-04 NOTE — Progress Notes (Signed)
Quick Note:  Please advise pt regarding following labs... Labs look good except LDL ("good) cholesterol is too high; the goal for patients with Diabetes is below 80. All other labs are normal except blood sugar is high. Thyroid test is normal.  Continue to work on healthier food choices and trying to be more active. These changes can help raise HDL ("good") cholesterol and lower your blood sugar, blood pressure, weight, and LDL cholesterol.  Copy to pt. ______

## 2012-11-05 ENCOUNTER — Telehealth: Payer: Self-pay | Admitting: *Deleted

## 2012-11-05 NOTE — Telephone Encounter (Signed)
Faxed signed order to [facility] in [facility], per Dr Audria Nine. Confirmation received at 11:16 am.

## 2012-11-21 ENCOUNTER — Other Ambulatory Visit: Payer: Self-pay | Admitting: Family Medicine

## 2012-12-16 ENCOUNTER — Other Ambulatory Visit: Payer: Self-pay | Admitting: Family Medicine

## 2013-01-04 ENCOUNTER — Other Ambulatory Visit: Payer: Self-pay | Admitting: Family Medicine

## 2013-02-04 ENCOUNTER — Other Ambulatory Visit: Payer: Self-pay | Admitting: Family Medicine

## 2013-02-07 NOTE — Telephone Encounter (Signed)
Needs office visit.

## 2013-03-09 ENCOUNTER — Ambulatory Visit (INDEPENDENT_AMBULATORY_CARE_PROVIDER_SITE_OTHER): Payer: BC Managed Care – PPO | Admitting: Family Medicine

## 2013-03-09 ENCOUNTER — Encounter: Payer: Self-pay | Admitting: Family Medicine

## 2013-03-09 VITALS — BP 120/76 | HR 63 | Temp 98.4°F | Resp 16 | Ht 68.0 in | Wt 248.6 lb

## 2013-03-09 DIAGNOSIS — E119 Type 2 diabetes mellitus without complications: Secondary | ICD-10-CM

## 2013-03-09 DIAGNOSIS — I10 Essential (primary) hypertension: Secondary | ICD-10-CM

## 2013-03-09 DIAGNOSIS — IMO0001 Reserved for inherently not codable concepts without codable children: Secondary | ICD-10-CM

## 2013-03-09 DIAGNOSIS — E1165 Type 2 diabetes mellitus with hyperglycemia: Secondary | ICD-10-CM

## 2013-03-09 DIAGNOSIS — E785 Hyperlipidemia, unspecified: Secondary | ICD-10-CM

## 2013-03-09 MED ORDER — METOPROLOL TARTRATE 25 MG PO TABS: ORAL_TABLET | ORAL | Status: DC

## 2013-03-09 MED ORDER — LISINOPRIL-HYDROCHLOROTHIAZIDE 10-12.5 MG PO TABS: ORAL_TABLET | ORAL | Status: DC

## 2013-03-09 MED ORDER — METFORMIN HCL ER 500 MG PO TB24: ORAL_TABLET | ORAL | Status: DC

## 2013-03-09 MED ORDER — FENOFIBRATE 145 MG PO TABS: 145.0000 mg | ORAL_TABLET | Freq: Every day | ORAL | Status: DC

## 2013-03-09 NOTE — Progress Notes (Signed)
S: This 58 y.o. Cauc female is here for DM follow-up and medication refills. She does not need refills today (at the time of the actual encounter, EPIC was not functioning). Pt does not need refills today. She reports FSBS~ 200. She tries to eat nutritiously; she is off all artificial sweeteners. Exercise- yoga once a week. Pt has a new job w/ [employer]; this position is better pay but only prn hours. Fortunately, she does not have to drive long distances with job. Pt denies diaphoresis, fatigue, vision disturbances, CP or tightness, palpitations, SOB or DOE, cough, myalgias or arthralgias, HA, dizziness, numbness, weakness or syncope.  Patient Active Problem List   Diagnosis Date Noted  . Colon polyp   . Over weight   . Galactorrhea   . ASCUS (atypical squamous cells of undetermined significance) on Pap smear   . Abnormal uterine bleeding   . Atrophic endometrium   . Amenorrhea   . DM (diabetes mellitus) 02/17/2011  . HYPERLIPIDEMIA 02/17/2011  . OSA on CPAP 02/17/2011  . NAFLD (nonalcoholic fatty liver disease) 16/10/9602  . Depression 02/17/2011  . Diverticula of colon 02/17/2011  . HYPERTENSION 09/12/2008  . PHLEBITIS 09/12/2008  . DVT 09/12/2008   Prior to Admission medications   Medication Sig Start Date End Date Taking? Authorizing Provider  aspirin 81 MG tablet Take 81 mg by mouth daily.   Yes Historical Provider, MD  cholecalciferol (VITAMIN D) 1000 UNITS tablet Take 1,000 Units by mouth daily.   Yes Historical Provider, MD  co-enzyme Q-10 30 MG capsule Take 30 mg by mouth 3 (three) times daily.   Yes Historical Provider, MD  CRANBERRY PO Take by mouth.   Yes Historical Provider, MD  cycloSPORINE (RESTASIS) 0.05 % ophthalmic emulsion 1 drop 2 (two) times daily.   Yes Historical Provider, MD  fenofibrate (TRICOR) 145 MG tablet Take 1 tablet (145 mg total) by mouth daily. 03/31/12  Yes Maurice March, MD  fenofibrate (TRICOR) 145 MG tablet TAKE 1 TABLET (145 MG TOTAL)  BY MOUTH DAILY. 11/21/12  Yes Maurice March, MD  fish oil-omega-3 fatty acids 1000 MG capsule Take 2 g by mouth daily.   Yes Historical Provider, MD  glimepiride (AMARYL) 4 MG tablet TAKE 1/2 (ONE-HALF) TABLET BY MOUTH TWICE DAILY BEFORE MAIN MEALS 02/04/13  Yes Godfrey Pick, PA-C  glucosamine-chondroitin 500-400 MG tablet Take 1 tablet by mouth 3 (three) times daily.   Yes Historical Provider, MD  lisinopril-hydrochlorothiazide (PRINZIDE,ZESTORETIC) 10-12.5 MG per tablet Take 1 tablet by mouth daily. 03/31/12  Yes Maurice March, MD  lisinopril-hydrochlorothiazide (PRINZIDE,ZESTORETIC) 10-12.5 MG per tablet TAKE 1 TABLET BY MOUTH DAILY. 12/16/12  Yes Maurice March, MD  metFORMIN (GLUCOPHAGE-XR) 500 MG 24 hr tablet TAKE 2 TABLETS (1,000 MG TOTAL) BY MOUTH 2 (TWO) TIMES DAILY. 01/04/13  Yes Eleanore E Debbra Riding, PA-C  metoprolol tartrate (LOPRESSOR) 25 MG tablet Take 1 tablet (25 mg total) by mouth 2 (two) times daily. 03/31/12  Yes Maurice March, MD  metoprolol tartrate (LOPRESSOR) 25 MG tablet TAKE 1 TABLET (25 MG TOTAL) BY MOUTH 2 (TWO) TIMES DAILY. 12/16/12  Yes Maurice March, MD  milk thistle 175 MG tablet Take 175 mg by mouth daily.   Yes Historical Provider, MD  Multiple Vitamins-Minerals (MULTIVITAMIN WITH MINERALS) tablet Take 1 tablet by mouth daily.   Yes Historical Provider, MD  OVER THE COUNTER MEDICATION Primrose Oil taking   Yes Historical Provider, MD  OVER THE COUNTER MEDICATION Biotin taking   Yes  Historical Provider, MD  Red Yeast Rice Extract (RED YEAST RICE PO) Take by mouth.   Yes Historical Provider, MD   PMHx, Surg Hx, Soc and Fam Hx reviewed.  ROS: As per HPI.  O: Filed Vitals:   03/09/13 0816  BP: 120/76  Pulse: 63  Temp: 98.4 F (36.9 C)  Resp: 16   GEN: In NAD; WN,WD. HENT: /AT; EOMI w/ clear conj/sclerae. Otherwise unremarkable. COR: RRR. LUNGS: Normal resp rate and effort. MS: MAEs; no deformities or joint effusions. NEURO: A&O x 3;  CNs intact. Nonfocal.  See DM Foot Exam.  A/P: Type II or unspecified type diabetes mellitus without mention of complication, not stated as uncontrolled - No medications changes at this time; encouraged nutritional restrictions and more physical activity. Pt states that she will be getting more active as the weather warms.       Plan: HM Diabetes Foot Exam  Hyperlipidemia- Continue current medications and nutrition modifications.  Meds ordered this encounter  Medications  . OVER THE COUNTER MEDICATION    Sig: Primrose Oil taking  . OVER THE COUNTER MEDICATION    Sig: Biotin taking  . fenofibrate (TRICOR) 145 MG tablet    Sig: Take 1 tablet (145 mg total) by mouth daily.    Dispense:  90 tablet    Refill:  2  . lisinopril-hydrochlorothiazide (PRINZIDE,ZESTORETIC) 10-12.5 MG per tablet    Sig: TAKE 1 TABLET BY MOUTH DAILY.    Dispense:  90 tablet    Refill:  2  . metFORMIN (GLUCOPHAGE-XR) 500 MG 24 hr tablet    Sig: TAKE 2 TABLETS (1,000 MG TOTAL) BY MOUTH 2 (TWO) TIMES DAILY.    Dispense:  360 tablet    Refill:  2  . metoprolol tartrate (LOPRESSOR) 25 MG tablet    Sig: TAKE 1 TABLET (25 MG TOTAL) BY MOUTH 2 (TWO) TIMES DAILY.    Dispense:  180 tablet    Refill:  2

## 2013-05-29 ENCOUNTER — Other Ambulatory Visit: Payer: Self-pay | Admitting: Family Medicine

## 2013-06-15 ENCOUNTER — Ambulatory Visit: Payer: BC Managed Care – PPO | Admitting: Family Medicine

## 2013-06-16 ENCOUNTER — Encounter: Payer: Self-pay | Admitting: Family Medicine

## 2013-06-16 ENCOUNTER — Ambulatory Visit (INDEPENDENT_AMBULATORY_CARE_PROVIDER_SITE_OTHER): Payer: BC Managed Care – PPO | Admitting: Family Medicine

## 2013-06-16 VITALS — BP 126/74 | HR 60 | Temp 98.0°F | Resp 16 | Ht 67.75 in | Wt 249.4 lb

## 2013-06-16 DIAGNOSIS — E1165 Type 2 diabetes mellitus with hyperglycemia: Secondary | ICD-10-CM

## 2013-06-16 DIAGNOSIS — E119 Type 2 diabetes mellitus without complications: Secondary | ICD-10-CM

## 2013-06-16 DIAGNOSIS — E785 Hyperlipidemia, unspecified: Secondary | ICD-10-CM

## 2013-06-16 DIAGNOSIS — IMO0001 Reserved for inherently not codable concepts without codable children: Secondary | ICD-10-CM

## 2013-06-16 LAB — COMPLETE METABOLIC PANEL WITH GFR
ALT: 61 U/L — ABNORMAL HIGH (ref 0–35)
AST: 43 U/L — ABNORMAL HIGH (ref 0–37)
Albumin: 4.3 g/dL (ref 3.5–5.2)
Alkaline Phosphatase: 85 U/L (ref 39–117)
BUN: 15 mg/dL (ref 6–23)
CO2: 27 mEq/L (ref 19–32)
Calcium: 10 mg/dL (ref 8.4–10.5)
Chloride: 99 mEq/L (ref 96–112)
Creat: 0.82 mg/dL (ref 0.50–1.10)
GFR, Est African American: >89 mL/min
GFR, Est Non African American: 79 mL/min
Glucose, Bld: 109 mg/dL — ABNORMAL HIGH (ref 70–99)
Potassium: 3.7 mEq/L (ref 3.5–5.3)
Sodium: 138 mEq/L (ref 135–145)
Total Bilirubin: 0.4 mg/dL (ref 0.2–1.2)
Total Protein: 7.2 g/dL (ref 6.0–8.3)

## 2013-06-16 LAB — LIPID PANEL
Cholesterol: 179 mg/dL (ref 0–200)
HDL: 43 mg/dL (ref >39)
LDL Cholesterol: 95 mg/dL (ref 0–99)
Total CHOL/HDL Ratio: 4.2 Ratio
Triglycerides: 204 mg/dL — ABNORMAL HIGH (ref <150)
VLDL: 41 mg/dL — ABNORMAL HIGH (ref 0–40)

## 2013-06-16 LAB — POCT GLYCOSYLATED HEMOGLOBIN (HGB A1C): Hemoglobin A1C: 7.9

## 2013-06-16 MED ORDER — FENOFIBRATE 145 MG PO TABS: 145.0000 mg | ORAL_TABLET | Freq: Every day | ORAL | Status: DC

## 2013-06-16 MED ORDER — METOPROLOL TARTRATE 25 MG PO TABS: ORAL_TABLET | ORAL | Status: AC

## 2013-06-16 MED ORDER — GLUCOSE BLOOD VI STRP: ORAL_STRIP | Status: DC

## 2013-06-16 MED ORDER — LISINOPRIL-HYDROCHLOROTHIAZIDE 10-12.5 MG PO TABS: ORAL_TABLET | ORAL | Status: AC

## 2013-06-16 NOTE — Patient Instructions (Signed)
Continue all current medications and follow-up in 4-5 months for complete physical. We may be repeating labs at that time.

## 2013-06-19 ENCOUNTER — Encounter: Payer: Self-pay | Admitting: Family Medicine

## 2013-06-19 NOTE — Progress Notes (Signed)
Quick Note:  Please advise pt regarding following labs... Lipids- Triglycerides are back up and this is related to abnormal blood sugar metabolism. The liver function tests that are above normal ( AST and ALT) are most likely due to a condition we call "fatty liver". This is related to your weight and Diabetes.  Continue current medications and focus on weight loss and good nutrition.  Copy to pt. ______

## 2013-06-19 NOTE — Progress Notes (Signed)
S:  This 58 y.o. Cauc female is here for DM and lipid disorder follow-up. Last A1c in Oct 2014= 7.9%. Pt not checking daily FSBS. No symptoms of hypoglycemia; no excessive thirst, hunger or polyuria. She is compliant with medications. No specific meal plan or regular fitness routine. Pt denies fatigue, diaphoresis, abnormal weight changes, vision disturbance, CP or palpitations, SOB or DOE, edema, GI problems, myalgias, HA, dizziness, numbness or syncope.  Medication refills discussed.  Patient Active Problem List   Diagnosis Date Noted  . Colon polyp   . Over weight   . Galactorrhea   . ASCUS (atypical squamous cells of undetermined significance) on Pap smear   . Abnormal uterine bleeding   . Atrophic endometrium   . Amenorrhea   . DM (diabetes mellitus), type 2, uncontrolled 02/17/2011  . HYPERLIPIDEMIA 02/17/2011  . OSA on CPAP 02/17/2011  . NAFLD (nonalcoholic fatty liver disease) 20/94/7096  . Depression 02/17/2011  . Diverticula of colon 02/17/2011  . HYPERTENSION 09/12/2008  . PHLEBITIS 09/12/2008  . DVT 09/12/2008   PMHx, Surg Hx, Soc and Fam Hx reviewed.   MEDICATIONS reconciled.  ROS: As per HPI.  O: Filed Vitals:   06/16/13 1354  BP: 126/74  Pulse: 60  Temp: 98 F (36.7 C)  Resp: 16   GEN: In NAD; WN,WD. Weight up 2 lbs. HENT: [place]/AT; EOMI w/ clear conj/sclerae. Otherwise unremarkable. COR: RRR. See DM Foot exam. LUNGS: Normal resp rate and effort. SKIN: W&D; intact w/o diaphoresis, erythema or pallor. MS: MAEs; no deformities or muscle atrophy. NEURO: A&O x 3; CNs intact. Nonfocal. See DM Foot exam.  A1c= 7.9%   A/P: Type II or unspecified type diabetes mellitus without mention of complication, not stated as uncontrolled - Stable; A1c unchanged. Plan: HM Diabetes Foot Exam, POCT glycosylated hemoglobin (Hb A1C), CANCELED: Hemoglobin A1c  Other and unspecified hyperlipidemia - Plan: fenofibrate (TRICOR) 145 MG tablet, Lipid panel, COMPLETE METABOLIC PANEL  WITH GFR  Meds ordered this encounter  Medications  . OVER THE COUNTER MEDICATION    Sig: OTC Celery Seed Extract taking daily  . glucose blood test strip    Sig: Use as instructed    Dispense:  100 each    Refill:  12    Provide strips to match glucometer.  Marland Kitchen lisinopril-hydrochlorothiazide (PRINZIDE,ZESTORETIC) 10-12.5 MG per tablet    Sig: TAKE 1 TABLET BY MOUTH DAILY.    Dispense:  30 tablet    Refill:  11  . metoprolol tartrate (LOPRESSOR) 25 MG tablet    Sig: TAKE 1 TABLET (25 MG TOTAL) BY MOUTH 2 (TWO) TIMES DAILY.    Dispense:  60 tablet    Refill:  11  . fenofibrate (TRICOR) 145 MG tablet    Sig: Take 1 tablet (145 mg total) by mouth daily.    Dispense:  30 tablet    Refill:  11

## 2013-06-22 ENCOUNTER — Encounter: Payer: Self-pay | Admitting: Radiology

## 2013-07-20 ENCOUNTER — Ambulatory Visit (INDEPENDENT_AMBULATORY_CARE_PROVIDER_SITE_OTHER): Payer: BC Managed Care – PPO | Admitting: Emergency Medicine

## 2013-07-20 ENCOUNTER — Ambulatory Visit (INDEPENDENT_AMBULATORY_CARE_PROVIDER_SITE_OTHER): Payer: BC Managed Care – PPO

## 2013-07-20 ENCOUNTER — Ambulatory Visit
Admission: RE | Admit: 2013-07-20 | Discharge: 2013-07-20 | Disposition: A | Discharge status: Home / Self Care | Payer: BC Managed Care – PPO | Source: Ambulatory Visit | Attending: Emergency Medicine | Admitting: Emergency Medicine

## 2013-07-20 VITALS — BP 120/80 | HR 74 | Temp 98.2°F | Resp 16 | Ht 68.0 in | Wt 248.0 lb

## 2013-07-20 DIAGNOSIS — R109 Unspecified abdominal pain: Secondary | ICD-10-CM

## 2013-07-20 DIAGNOSIS — R10A1 Flank pain, right side: Secondary | ICD-10-CM

## 2013-07-20 DIAGNOSIS — E119 Type 2 diabetes mellitus without complications: Secondary | ICD-10-CM

## 2013-07-20 LAB — POCT CBC
Granulocyte percent: 55.3 %G (ref 37–80)
HEMATOCRIT: 41.1 % (ref 37.7–47.9)
Hemoglobin: 13.3 g/dL (ref 12.2–16.2)
LYMPH, POC: 2.4 (ref 0.6–3.4)
MCH, POC: 30.2 pg (ref 27–31.2)
MCHC: 32.4 g/dL (ref 31.8–35.4)
MCV: 93.5 fL (ref 80–97)
MID (CBC): 0.5 (ref 0–0.9)
MPV: 8.3 fL (ref 0–99.8)
PLATELET COUNT, POC: 282 10*3/uL (ref 142–424)
POC GRANULOCYTE: 3.5 (ref 2–6.9)
POC LYMPH PERCENT: 37.1 %L (ref 10–50)
POC MID %: 7.6 % (ref 0–12)
RBC: 4.4 M/uL (ref 4.04–5.48)
RDW, POC: 14.2 %
WBC: 6.4 10*3/uL (ref 4.6–10.2)

## 2013-07-20 LAB — POCT UA - MICROSCOPIC ONLY
CASTS, UR, LPF, POC: NEGATIVE
Crystals, Ur, HPF, POC: NEGATIVE
Epithelial cells, urine per micros: NEGATIVE
MUCUS UA: NEGATIVE
RBC, urine, microscopic: NEGATIVE
WBC, Ur, HPF, POC: NEGATIVE
YEAST UA: NEGATIVE

## 2013-07-20 LAB — POCT URINALYSIS DIPSTICK
Bilirubin, UA: NEGATIVE
Blood, UA: NEGATIVE
Glucose, UA: 500
Ketones, UA: NEGATIVE
Leukocytes, UA: NEGATIVE
NITRITE UA: NEGATIVE
PH UA: 5.5
PROTEIN UA: NEGATIVE
Spec Grav, UA: 1.015
Urobilinogen, UA: 0.2

## 2013-07-20 LAB — GLUCOSE, POCT (MANUAL RESULT ENTRY): POC Glucose: 249 mg/dl — AB (ref 70–99)

## 2013-07-20 MED ORDER — HYDROCODONE-ACETAMINOPHEN 5-325 MG PO TABS: 1.0000 | ORAL_TABLET | Freq: Four times a day (QID) | ORAL | Status: DC | PRN

## 2013-07-20 NOTE — Progress Notes (Signed)
Subjective:    Patient ID: ICOLE PLACHTA, female    DOB: 27-Dec-1955, 58 y.o.   MRN: [id number]  HPI patient states that yesterday she had mild right flank pain discomfort. This seemed to resolve and then last night she developed fairly severe right flank and mild abdominal discomfort. She has had some mild nausea but no vomiting she has had normal bowel movements. She did have one episode of stool incontinence yesterday. She does not have any urinary symptoms of burning stinging or pain on urination    Review of Systems     Objective:   Physical Exam patient is alert and cooperative she is not in any distress. Chest was clear. The abdomen is flat. There is no abdominal tenderness on exam. There are multiple scars over the left knee  [facility] reading (PRIMARY) by  Dr.Johann Gascoigne there is L3-L4 degenerative disc disease. There is a spondylolisthesis L5 on S1. There is evidence of a sacroiliitis.Vascular calc. Results for orders placed in visit on 07/20/13  POCT CBC      Result Value Ref Range   WBC 6.4  4.6 - 10.2 K/uL   Lymph, poc 2.4  0.6 - 3.4   POC LYMPH PERCENT 37.1  10 - 50 %L   MID (cbc) 0.5  0 - 0.9   POC MID % 7.6  0 - 12 %M   POC Granulocyte 3.5  2 - 6.9   Granulocyte percent 55.3  37 - 80 %G   RBC 4.40  4.04 - 5.48 M/uL   Hemoglobin 13.3  12.2 - 16.2 g/dL   HCT, POC 66.4  40.3 - 47.9 %   MCV 93.5  80 - 97 fL   MCH, POC 30.2  27 - 31.2 pg   MCHC 32.4  31.8 - 35.4 g/dL   RDW, POC 47.4     Platelet Count, POC 282  142 - 424 K/uL   MPV 8.3  0 - 99.8 fL  POCT URINALYSIS DIPSTICK      Result Value Ref Range   Color, UA bright yellow     Clarity, UA clear     Glucose, UA 500     Bilirubin, UA neg     Ketones, UA neg     Spec Grav, UA 1.015     Blood, UA neg     pH, UA 5.5     Protein, UA neg     Urobilinogen, UA 0.2     Nitrite, UA neg     Leukocytes, UA Negative    POCT UA - MICROSCOPIC ONLY      Result Value Ref Range   WBC, Ur, HPF, POC neg     RBC, urine, microscopic  neg     Bacteria, U Microscopic trace     Mucus, UA neg     Epithelial cells, urine per micros neg     Crystals, Ur, HPF, POC neg     Casts, Ur, LPF, POC neg     Yeast, UA neg    GLUCOSE, POCT (MANUAL RESULT ENTRY)      Result Value Ref Range   POC Glucose 249 (*) 70 - 99 mg/dl        Assessment & Plan:  She does have significant vascular calcification on x-ray. We'll treat with hydrocodone for pain relief. Her x-rays do show significant back disease with degenerative disc disease as well as spondylolisthesis. She was given hydrocodone for pain. We'll proceed with a CT of the abdomen oral  contrast only .

## 2013-07-21 ENCOUNTER — Other Ambulatory Visit: Payer: Self-pay | Admitting: Physician Assistant

## 2013-07-21 ENCOUNTER — Telehealth: Payer: Self-pay

## 2013-07-21 LAB — URINE CULTURE

## 2013-07-21 NOTE — Telephone Encounter (Signed)
Patient calling for her cscan done yesterday.   Dr. Cleta Albertsaub  [phone] Judie Petit(M)

## 2013-07-21 NOTE — Telephone Encounter (Signed)
Spoke with pt and informed of results

## 2013-07-22 NOTE — Telephone Encounter (Signed)
Dr. Cleta Albertsaub, Pt wants to know what her renal cyst means and if it could be the cause of her back pain.

## 2013-07-22 NOTE — Telephone Encounter (Signed)
Call patient cysts not the cause of her pain.Recheck here next week if pain persists

## 2013-07-22 NOTE — Telephone Encounter (Signed)
Lm for rtn call- RTC if pain increases or does not resolve.

## 2013-08-12 DIAGNOSIS — IMO0002 Reserved for concepts with insufficient information to code with codable children: Secondary | ICD-10-CM

## 2013-08-12 DIAGNOSIS — E1165 Type 2 diabetes mellitus with hyperglycemia: Secondary | ICD-10-CM

## 2013-08-17 ENCOUNTER — Encounter: Payer: Self-pay | Admitting: Family Medicine

## 2013-09-21 ENCOUNTER — Encounter: Payer: Self-pay | Admitting: Family Medicine

## 2013-11-11 ENCOUNTER — Other Ambulatory Visit: Payer: Self-pay

## 2013-11-17 ENCOUNTER — Encounter: Payer: BC Managed Care – PPO | Admitting: Family Medicine

## 2013-11-23 ENCOUNTER — Other Ambulatory Visit: Payer: Self-pay | Admitting: Family Medicine

## 2013-12-01 ENCOUNTER — Encounter: Payer: Self-pay | Admitting: Family Medicine

## 2013-12-01 ENCOUNTER — Ambulatory Visit (INDEPENDENT_AMBULATORY_CARE_PROVIDER_SITE_OTHER): Payer: BC Managed Care – PPO | Admitting: Family Medicine

## 2013-12-01 VITALS — BP 130/72 | HR 81 | Temp 98.1°F | Resp 16 | Ht 68.0 in | Wt 233.8 lb

## 2013-12-01 DIAGNOSIS — R748 Abnormal levels of other serum enzymes: Secondary | ICD-10-CM

## 2013-12-01 DIAGNOSIS — Z23 Encounter for immunization: Secondary | ICD-10-CM

## 2013-12-01 DIAGNOSIS — Z1389 Encounter for screening for other disorder: Secondary | ICD-10-CM

## 2013-12-01 DIAGNOSIS — Z0000 Encounter for general adult medical examination without abnormal findings: Secondary | ICD-10-CM

## 2013-12-01 DIAGNOSIS — R109 Unspecified abdominal pain: Secondary | ICD-10-CM

## 2013-12-01 DIAGNOSIS — E119 Type 2 diabetes mellitus without complications: Secondary | ICD-10-CM

## 2013-12-01 LAB — POCT URINALYSIS DIPSTICK
Bilirubin, UA: NEGATIVE
Glucose, UA: NEGATIVE
Ketones, UA: NEGATIVE
Leukocytes, UA: NEGATIVE
Nitrite, UA: NEGATIVE
PH UA: 7
Protein, UA: NEGATIVE
RBC UA: NEGATIVE
Spec Grav, UA: <=1.005
UROBILINOGEN UA: 0.2

## 2013-12-01 LAB — POCT GLYCOSYLATED HEMOGLOBIN (HGB A1C): Hemoglobin A1C: 6

## 2013-12-01 MED ORDER — GLIMEPIRIDE 4 MG PO TABS: ORAL_TABLET | ORAL | Status: DC

## 2013-12-01 NOTE — Progress Notes (Signed)
Subjective:    Patient ID: Allison Mcdaniel, female    DOB: [birth year], 58 y.o.   MRN: [id number]  HPI  This 58 y.o. Cauc female is here for annual CPE; she has Type II DM, HTN, OSA, obesity and dyslipidemia.   HCM: MMG- Current.           PAP- Current (2014).           CRS- Current- 2007 (normal w/ 10-year recall).           IMM- Current.           Vision- Every 1-2 years.           Dental- ?  See documentation per Raelyn Ensign, PA-C.   Review of Systems  Constitutional: Negative.   HENT: Negative.   Eyes: Negative.   Respiratory: Negative.   Cardiovascular: Negative.   Gastrointestinal: Negative.   Endocrine: Negative.   Genitourinary: Positive for flank pain.  Musculoskeletal: Positive for myalgias, back pain, joint swelling and arthralgias.  Skin: Negative.   Allergic/Immunologic: Positive for environmental allergies and food allergies.  Neurological: Negative.   Hematological: Negative.   Psychiatric/Behavioral: Negative.       Objective:   Physical Exam I have reviewed exam and documentation per PA-C and agree with his findings.     Assessment & Plan:  Encounter for routine history and physical exam in female patient  Diabetes mellitus without complication - Much improved with weight loss and nutrition changes. Pt's goal is to discontinue some medications; will stop Glimepiride but continue Metformin. Plan: POCT glycosylated hemoglobin (Hb A1C), Comprehensive metabolic panel, Microalbumin, urine  Right flank pain - Resolved with nutrition modifications.  Plan: POCT urinalysis dipstick  Elevated liver enzymes- Fatty liver; should improve due to weight loss.  Screening for gout - Plan: Uric Acid  Need for prophylactic vaccination against Streptococcus pneumoniae (pneumococcus) - Plan: Pneumococcal conjugate vaccine 13-valent IM   Maurice March, MD [facility]

## 2013-12-01 NOTE — Patient Instructions (Signed)
Keep up the good work!  We've stopped your amaryl today because your A1C is so improved! We'll see you back in 3 months.  We'll contact you with the lab results.

## 2013-12-01 NOTE — Progress Notes (Signed)
Subjective:    Patient ID: Allison Mcdaniel, female    DOB: [birth year], 58 y.o.   MRN: [id number]  Allison Mcdaniel., MD  Chief Complaint  Patient presents with  . Annual Exam    with medication refill AMARYL   Prior to Admission medications   Medication Sig Start Date End Date Taking? Authorizing Provider  aspirin 81 MG tablet Take 81 mg by mouth daily.   Yes Historical Provider, MD  Barberry-Oreg Grape-Goldenseal (BERBERINE COMPLEX PO) Take by mouth daily.   Yes Historical Provider, MD  cholecalciferol (VITAMIN D) 1000 UNITS tablet Take 1,000 Units by mouth daily.   Yes Historical Provider, MD  co-enzyme Q-10 30 MG capsule Take 30 mg by mouth 3 (three) times daily.   Yes Historical Provider, MD  fenofibrate (TRICOR) 145 MG tablet Take 1 tablet (145 mg total) by mouth daily. 06/16/13  Yes Maurice March, MD  fish oil-omega-3 fatty acids 1000 MG capsule Take 2 g by mouth daily.   Yes Historical Provider, MD  glucose blood test strip Use as instructed 06/16/13  Yes Maurice March, MD  lisinopril-hydrochlorothiazide (PRINZIDE,ZESTORETIC) 10-12.5 MG per tablet TAKE 1 TABLET BY MOUTH DAILY. 06/16/13  Yes Maurice March, MD  metFORMIN (GLUCOPHAGE-XR) 500 MG 24 hr tablet TAKE 2 TABLETS (1,000 MG TOTAL) BY MOUTH 2 (TWO) TIMES DAILY. 03/09/13  Yes Maurice March, MD  metoprolol tartrate (LOPRESSOR) 25 MG tablet TAKE 1 TABLET (25 MG TOTAL) BY MOUTH 2 (TWO) TIMES DAILY. 06/16/13  Yes Maurice March, MD  milk thistle 175 MG tablet Take 175 mg by mouth daily.   Yes Historical Provider, MD  Multiple Vitamins-Minerals (MULTIVITAMIN WITH MINERALS) tablet Take 1 tablet by mouth daily.   Yes Historical Provider, MD  OVER THE COUNTER MEDICATION Primrose Oil taking   Yes Historical Provider, MD  OVER THE COUNTER MEDICATION OTC Celery Seed Extract taking daily   Yes Historical Provider, MD  Red Yeast Rice Extract (RED YEAST RICE PO) Take by mouth.   Yes Historical Provider, MD  TURMERIC PO  Take by mouth 2 (two) times daily.   Yes Historical Provider, MD   Medications, allergies, past medical history, surgical history, family history, social history and problem list reviewed and updated.  HPI  58 yof with PMH DMII, HTN, OSA, and hyperlipidemia presents for check up/med refill.   DM - checks BG fasting BG every morning, running 120-130s. She has lost 15# in the past couple months. Exercising more, swimming 2x/wk and walking her dogs daily. She has recently changed her diet extensively, she has cut out dairy and gluten. She is no longer drinking sodas which is a big change for her. Taking her meds as perscribed. She saw optho in July 2015 and had no retinopathy.   HTN - Has been checking with wrist cuff at home. Running "normal."   OSA - using cpap every night  Working as a prn PT.   Right flank pain - originally came on in June 2015. Had xr at that time that showed degenerative changes in thoracolumbar spine and SI joints. Also had a neg CT abd/pelvis. She took norco which helped. Since June the right flank pain has come and gone. She denies any dysuria, hematuria, fever, chills, constipation, or diarrhea. She has started seeing a chiropractor which has helped a bit. A few wks ago she completely cut out gluten/dairy/ and soda and has noticed the pain has really resolved. She ate poorly for a couple days recently and  the pain returned. She does not connect the pain with fatty food intake.   Due for flu and prevnar vaccines today. Declines flu.   No other new complaints or issues.  Review of Systems No CP, SOB. See HPI.     Objective:   Physical Exam  Constitutional: She is oriented to person, place, and time. She appears well-developed and well-nourished.  Non-toxic appearance. She does not have a sickly appearance. She does not appear ill. No distress.  BP 130/72 mmHg  Pulse 81  Temp(Src) 98.1 F (36.7 C) (Oral)  Resp 16  Ht 5\' 8"  (1.727 m)  Wt 233 lb 12.8 oz (106.051  kg)  BMI 35.56 kg/m2  SpO2 96%   Cardiovascular: Normal rate, regular rhythm, S1 normal, S2 normal and normal heart sounds.  Exam reveals no gallop.   No murmur heard. Pulses:      Dorsalis pedis pulses are 2+ on the right side, and 2+ on the left side.       Posterior tibial pulses are 2+ on the right side, and 2+ on the left side.  Pulmonary/Chest: Effort normal and breath sounds normal. She has no decreased breath sounds. She has no wheezes. She has no rhonchi. She has no rales.  Abdominal: Soft. Normal appearance and bowel sounds are normal. She exhibits no pulsatile liver. There is no splenomegaly or hepatomegaly. There is no tenderness. There is no CVA tenderness.  No rash over right flank.   Neurological: She is alert and oriented to person, place, and time. No cranial nerve deficit.  Psychiatric: She has a normal mood and affect. Her speech is normal.   Results for orders placed or performed in visit on 12/01/13  POCT urinalysis dipstick  Result Value Ref Range   Color, UA yellow    Clarity, UA clear    Glucose, UA neg    Bilirubin, UA neg    Ketones, UA neg    Spec Grav, UA <=1.005    Blood, UA neg    pH, UA 7.0    Protein, UA neg    Urobilinogen, UA 0.2    Nitrite, UA neg    Leukocytes, UA Negative   POCT glycosylated hemoglobin (Hb A1C)  Result Value Ref Range   Hemoglobin A1C 6.0        Assessment & Plan:   58 yof with PMH DMII, HTN, OSA, and hyperlipidemia presents for check up/med refill.   Right flank pain - Plan: POCT urinalysis dipstick --neg hematuria --not concerning for stone, pyelo --as it improved with diet changes we will wait and see if still resolved at next appt 3 months  Need for prophylactic vaccination against Streptococcus pneumoniae (pneumococcus) - Plan: Pneumococcal conjugate vaccine 13-valent IM  Screening for gout - Plan: Uric Acid  Diabetes mellitus without complication - Plan: POCT glycosylated hemoglobin (Hb A1C), Comprehensive  metabolic panel, Microalbumin, urine --Continue significant changes already made to diet/exercise --A1C down from 7.9 to 6.0 today. --DC amaryl --continue metformin for now, rtc 3 months --cmp, urine ma  Elevated liver enzymes --Elevated at last visit, most likely secondary to NAFLD --CMP today  Donnajean Lopes, PA-C Physician Assistant-Certified [facility]  12/01/2013 4:14 PM

## 2013-12-02 LAB — COMPREHENSIVE METABOLIC PANEL
ALBUMIN: 4.3 g/dL (ref 3.5–5.2)
ALT: 21 U/L (ref 0–35)
AST: 20 U/L (ref 0–37)
Alkaline Phosphatase: 79 U/L (ref 39–117)
BILIRUBIN TOTAL: 0.4 mg/dL (ref 0.2–1.2)
BUN: 12 mg/dL (ref 6–23)
CO2: 28 mEq/L (ref 19–32)
Calcium: 9.7 mg/dL (ref 8.4–10.5)
Chloride: 102 mEq/L (ref 96–112)
Creat: 0.96 mg/dL (ref 0.50–1.10)
GLUCOSE: 144 mg/dL — AB (ref 70–99)
Potassium: 4 mEq/L (ref 3.5–5.3)
Sodium: 138 mEq/L (ref 135–145)
Total Protein: 7.1 g/dL (ref 6.0–8.3)

## 2013-12-02 LAB — MICROALBUMIN, URINE: Microalb, Ur: <0.2 mg/dL (ref <2.0)

## 2013-12-02 LAB — URIC ACID: Uric Acid, Serum: 4.4 mg/dL (ref 2.4–7.0)

## 2013-12-03 ENCOUNTER — Encounter: Payer: Self-pay | Admitting: Family Medicine

## 2013-12-08 ENCOUNTER — Other Ambulatory Visit: Payer: Self-pay | Admitting: Family Medicine

## 2014-01-01 ENCOUNTER — Other Ambulatory Visit: Payer: Self-pay | Admitting: Family Medicine

## 2014-02-23 ENCOUNTER — Other Ambulatory Visit: Payer: Self-pay | Admitting: Family Medicine

## 2014-03-02 ENCOUNTER — Other Ambulatory Visit: Payer: Self-pay | Admitting: Family Medicine

## 2014-03-02 ENCOUNTER — Encounter: Payer: Self-pay | Admitting: Family Medicine

## 2014-03-02 ENCOUNTER — Ambulatory Visit (INDEPENDENT_AMBULATORY_CARE_PROVIDER_SITE_OTHER): Payer: BLUE CROSS/BLUE SHIELD | Admitting: Family Medicine

## 2014-03-02 VITALS — BP 115/75 | HR 61 | Temp 97.7°F | Resp 16 | Ht 68.5 in | Wt 223.0 lb

## 2014-03-02 DIAGNOSIS — E1169 Type 2 diabetes mellitus with other specified complication: Secondary | ICD-10-CM

## 2014-03-02 DIAGNOSIS — IMO0002 Reserved for concepts with insufficient information to code with codable children: Secondary | ICD-10-CM

## 2014-03-02 DIAGNOSIS — E785 Hyperlipidemia, unspecified: Secondary | ICD-10-CM

## 2014-03-02 DIAGNOSIS — E1165 Type 2 diabetes mellitus with hyperglycemia: Secondary | ICD-10-CM

## 2014-03-02 LAB — BASIC METABOLIC PANEL
BUN: 13 mg/dL (ref 6–23)
CHLORIDE: 101 meq/L (ref 96–112)
CO2: 29 meq/L (ref 19–32)
CREATININE: 0.9 mg/dL (ref 0.50–1.10)
Calcium: 9.9 mg/dL (ref 8.4–10.5)
GLUCOSE: 161 mg/dL — AB (ref 70–99)
POTASSIUM: 4.4 meq/L (ref 3.5–5.3)
SODIUM: 138 meq/L (ref 135–145)

## 2014-03-02 LAB — LIPID PANEL
CHOLESTEROL: 193 mg/dL (ref 0–200)
HDL: 46 mg/dL (ref >39)
LDL CALC: 117 mg/dL — AB (ref 0–99)
Total CHOL/HDL Ratio: 4.2 Ratio
Triglycerides: 149 mg/dL (ref <150)
VLDL: 30 mg/dL (ref 0–40)

## 2014-03-02 LAB — POCT GLYCOSYLATED HEMOGLOBIN (HGB A1C): HEMOGLOBIN A1C: 6.1

## 2014-03-02 NOTE — Patient Instructions (Signed)
Tetanus is due at your next visit. I will see you in 4 months.

## 2014-03-02 NOTE — Progress Notes (Signed)
S:  This 59 y.o. Female has Type II DM with dyslipidemia, NAFLD and HTN. She has managed to lose 10 lbs since Nov [id number]; her 61 year old dog died and she admits overindulging in "comfort foods". Winter weather limits her exercise but she will resume swimming and other activities soon. Pt's goal is medication reduction.  Patient Active Problem List   Diagnosis Date Noted  . Colon polyp   . Over weight   . Galactorrhea   . ASCUS (atypical squamous cells of undetermined significance) on Pap smear   . Abnormal uterine bleeding   . Atrophic endometrium   . Amenorrhea   . DM (diabetes mellitus), type 2, uncontrolled 02/17/2011  . Dyslipidemia associated with type 2 diabetes mellitus 02/17/2011  . OSA on CPAP 02/17/2011  . NAFLD (nonalcoholic fatty liver disease) 45/40/981101/21/2013  . Depression 02/17/2011  . Diverticula of colon 02/17/2011  . HYPERTENSION 09/12/2008  . PHLEBITIS 09/12/2008  . DVT 09/12/2008    Prior to Admission medications   Medication Sig Start Date End Date Taking? Authorizing Provider  aspirin 81 MG tablet Take 81 mg by mouth daily.   Yes Historical Provider, MD  co-enzyme Q-10 30 MG capsule Take 30 mg by mouth 3 (three) times daily.   Yes Historical Provider, MD  fenofibrate (TRICOR) 145 MG tablet Take 1 tablet (145 mg total) by mouth daily. 06/16/13  Yes Maurice MarchBarbara B Havana Baldwin, MD  fish oil-omega-3 fatty acids 1000 MG capsule Take 2 g by mouth daily.   Yes Historical Provider, MD  glucose blood test strip Use as instructed 06/16/13  Yes Maurice MarchBarbara B Aquilla Shambley, MD  lisinopril-hydrochlorothiazide (PRINZIDE,ZESTORETIC) 10-12.5 MG per tablet TAKE 1 TABLET BY MOUTH DAILY. 06/16/13  Yes Maurice MarchBarbara B Marketia Stallsmith, MD  metFORMIN (GLUCOPHAGE-XR) 500 MG 24 hr tablet TAKE 2 TABLETS (1,000 MG TOTAL)  BY MOUTH 2 (TWO) TIMES DAILY 01/01/14  Yes Morrell RiddleSarah L Weber, PA-C  metoprolol tartrate (LOPRESSOR) 25 MG tablet TAKE 1 TABLET (25 MG TOTAL) BY MOUTH 2 (TWO) TIMES DAILY. 06/16/13  Yes Maurice MarchBarbara B Marybella Ethier, MD   milk thistle 175 MG tablet Take 175 mg by mouth daily.   Yes Historical Provider, MD  OVER THE COUNTER MEDICATION OTC Celery Seed Extract taking daily   Yes Historical Provider, MD  Red Yeast Rice Extract (RED YEAST RICE PO) Take by mouth.   Yes Historical Provider, MD  Barberry-Oreg Grape-Goldenseal (BERBERINE COMPLEX PO) Take by mouth daily.    Historical Provider, MD  cholecalciferol (VITAMIN D) 1000 UNITS tablet Take 1,000 Units by mouth daily.    Historical Provider, MD  Multiple Vitamins-Minerals (MULTIVITAMIN WITH MINERALS) tablet Take 1 tablet by mouth daily.    Historical Provider, MD  OVER THE COUNTER MEDICATION Primrose Oil taking    Historical Provider, MD  TURMERIC PO Take by mouth 2 (two) times daily.    Historical Provider, MD    ROS: Negative for fatigue, anorexia, diaphoresis, vision disturbances, symptoms of hypoglycemia, CP or tightness, palpitations, edema, SOB or DOE, cough, GI upset, HA, dizziness, lightheadedness, numbness, weakness or syncope.  O: Filed Vitals:   03/02/14 0826  BP: 115/75  Pulse: 61  Temp: 97.7 F (36.5 C)  Resp: 16   GEN: In NAD; WN,WD. HENT: [place]/AT; EOMI w/ clear conj/sclerae; otherwise unremarkable. COR: RRR. LUNGS: Unlabored resp. SKIN; W&D; no jaundice or pallor. MS: MAEs; no deformities or atrophy. NEURO: A&O x 3; CNs intact. Nonfocal.  Results for orders placed or performed in visit on 03/02/14  POCT glycosylated hemoglobin (Hb A1C)  Result  Value Ref Range   Hemoglobin A1C 6.1     A/P: DM (diabetes mellitus), type 2, uncontrolled - Continue current medications. Resume healthy lifestyle habits. Plan: Basic metabolic panel, POCT glycosylated hemoglobin (Hb A1C)  Dyslipidemia associated with type 2 diabetes mellitus - Continue fenofibrate, fish oil supplement and Red Yeast Rice Extract pending labs. Plan: Lipid panel

## 2014-03-07 ENCOUNTER — Other Ambulatory Visit: Payer: Self-pay | Admitting: Family Medicine

## 2014-03-28 ENCOUNTER — Other Ambulatory Visit: Payer: Self-pay | Admitting: Physician Assistant

## 2014-04-29 ENCOUNTER — Ambulatory Visit (INDEPENDENT_AMBULATORY_CARE_PROVIDER_SITE_OTHER): Payer: BLUE CROSS/BLUE SHIELD | Admitting: Family Medicine

## 2014-04-29 VITALS — BP 110/62 | HR 69 | Temp 98.2°F | Resp 12 | Ht 68.0 in | Wt 229.5 lb

## 2014-04-29 DIAGNOSIS — H109 Unspecified conjunctivitis: Secondary | ICD-10-CM

## 2014-04-29 DIAGNOSIS — Z23 Encounter for immunization: Secondary | ICD-10-CM

## 2014-04-29 DIAGNOSIS — Z7189 Other specified counseling: Secondary | ICD-10-CM | POA: Diagnosis not present

## 2014-04-29 DIAGNOSIS — Z7185 Encounter for immunization safety counseling: Secondary | ICD-10-CM

## 2014-04-29 MED ORDER — TOBRAMYCIN 0.3 % OP SOLN: 1.0000 [drp] | OPHTHALMIC | Status: DC

## 2014-04-29 MED ORDER — TETANUS-DIPHTHERIA TOXOIDS TD 5-2 LFU IM INJ: 0.5000 mL | INJECTION | Freq: Once | INTRAMUSCULAR | Status: DC

## 2014-04-29 NOTE — Progress Notes (Signed)
Chief Complaint:  Chief Complaint  Patient presents with  . Conjunctivitis  . Immunizations    Td    HPI: 59 y.o. year old female presents with a 1 day history of eye redness involving left eye.  no contact use  No sick contacts, recent antibiotics, or recent travels.   No leg trauma, sedentary periods, h/o cancer, or tobacco use.  Patient is also due for tetanus shot  Past Medical History  Diagnosis Date  . Depression   . Diabetes mellitus   . Hypertension   . Hyperlipidemia   . OSA on CPAP   . NAFLD (nonalcoholic fatty liver disease)   . Diverticulosis   . Colon polyp   . Over weight   . Galactorrhea   . ASCUS (atypical squamous cells of undetermined significance) on Pap smear   . Abnormal uterine bleeding   . Atrophic endometrium   . Amenorrhea   . Arthritis   . Blood transfusion without reported diagnosis      Home Meds: Prior to Admission medications   Medication Sig Start Date End Date Taking? Authorizing Provider  aspirin 81 MG tablet Take 81 mg by mouth daily.   Yes Historical Provider, MD  co-enzyme Q-10 30 MG capsule Take 30 mg by mouth 3 (three) times daily.   Yes Historical Provider, MD  fenofibrate (TRICOR) 145 MG tablet Take 1 tablet (145 mg total) by mouth daily. 06/16/13  Yes Maurice MarchBarbara B McPherson, MD  fish oil-omega-3 fatty acids 1000 MG capsule Take 2 g by mouth daily.   Yes Historical Provider, MD  glucose blood test strip Use as instructed 06/16/13  Yes Maurice MarchBarbara B McPherson, MD  lisinopril-hydrochlorothiazide (PRINZIDE,ZESTORETIC) 10-12.5 MG per tablet TAKE 1 TABLET BY MOUTH DAILY. 06/16/13  Yes Maurice MarchBarbara B McPherson, MD  metFORMIN (GLUCOPHAGE-XR) 500 MG 24 hr tablet TAKE 2 TABLETS BY MOUTH TWICE A DAY 03/29/14  Yes Maurice MarchBarbara B McPherson, MD  metoprolol tartrate (LOPRESSOR) 25 MG tablet TAKE 1 TABLET (25 MG TOTAL) BY MOUTH 2 (TWO) TIMES DAILY. 06/16/13  Yes Maurice MarchBarbara B McPherson, MD  milk thistle 175 MG tablet Take 175 mg by mouth daily.   Yes Historical  Provider, MD  OVER THE COUNTER MEDICATION OTC Celery Seed Extract taking daily   Yes Historical Provider, MD  Red Yeast Rice Extract (RED YEAST RICE PO) Take by mouth.   Yes Historical Provider, MD  tobramycin (TOBREX) 0.3 % ophthalmic solution Place 1 drop into the left eye every 4 (four) hours. 04/29/14   Elvina SidleKurt Ilona Colley, MD    Allergies:  Allergies  Allergen Reactions  . Bextra [Valdecoxib]   . Penicillins     History   Social History  . Marital Status: Single    Spouse Name: N/A  . Number of Children: N/A  . Years of Education: N/A   Occupational History  . physical therapist    Social History Main Topics  . Smoking status: Never Smoker   . Smokeless tobacco: Never Used  . Alcohol Use: 0.0 oz/week    0 Standard drinks or equivalent per week     Comment: 1 or less  . Drug Use: No  . Sexual Activity: No   Other Topics Concern  . Not on file   Social History Narrative     Review of Systems: Constitutional: negative for chills, fever, night sweats or weight changes Cardiovascular: negative for chest pain or palpitations Respiratory: negative for hemoptysis, wheezing, or shortness of breath Abdominal: negative for abdominal pain, nausea, vomiting or diarrhea  Dermatological: negative for rash Neurologic: negative for headache   Physical Exam: Blood pressure 110/62, pulse 69, temperature 98.2 F (36.8 C), temperature source Oral, resp. rate 12, height  (1.727 m), weight 229 lb 8 oz (104.101 kg), SpO2 97 %., Body mass index is 34.9 kg/(m^2). General: Well developed, well nourished, in no acute distress. Head: Normocephalic, atraumatic, nares are congested. Bilateral auditory canals clear, TM's are without perforation, pearly grey with reflective cone of light bilaterally. No sinus TTP. Oral cavity moist, dentition normal. Posterior pharynx with post nasal drip and mild erythema. No peritonsillar abscess or tonsillar exudate. Eyes:  Injection left eye, mild  discharge at lid margins Neck: Supple. No thyromegaly. Full ROM. No lymphadenopathy. Lungs: Coarse breath sounds bilaterally without wheezes, rales, or rhonchi. Breathing is unlabored.  Heart: RRR with S1 S2. No murmurs, rubs, or gallops appreciated. Msk:  Strength and tone normal for age. Extremities: No clubbing or cyanosis. No edema. Neuro: Alert and oriented X 3. Moves all extremities spontaneously. CNII-XII grossly in tact. Psych:  Responds to questions appropriately with a normal affect.     ASSESSMENT AND PLAN:  59 y.o. year old female with conjunctivitis involving left eye. -   ICD-9-CM ICD-10-CM   1. Conjunctivitis of left eye 372.30 H10.9 tobramycin (TOBREX) 0.3 % ophthalmic solution  2. Immunization counseling V65.49 Z71.89 tetanus & diphtheria toxoids (adult) (TENIVAC) injection 0.5 mL   Pt ed given including hygiene measures and avoiding use of contact lenses -RTC precautions -RTC 3-5 days if no improvement  Signed, Elvina Sidle, MD 04/29/2014 1:15 PM

## 2014-04-29 NOTE — Patient Instructions (Signed)

## 2014-05-27 ENCOUNTER — Telehealth: Payer: Self-pay | Admitting: Family Medicine

## 2014-05-27 NOTE — Telephone Encounter (Signed)
lmom to call back to reschedule her appt that she had with Mcpherson on 06/29/14 please reschedule with another provider

## 2014-06-29 ENCOUNTER — Ambulatory Visit: Payer: BLUE CROSS/BLUE SHIELD | Admitting: Family Medicine

## 2014-06-29 ENCOUNTER — Other Ambulatory Visit: Payer: Self-pay | Admitting: Family Medicine

## 2014-08-23 ENCOUNTER — Other Ambulatory Visit: Payer: Self-pay | Admitting: Family Medicine

## 2014-09-23 ENCOUNTER — Other Ambulatory Visit: Payer: Self-pay | Admitting: Physician Assistant

## 2015-05-09 DIAGNOSIS — M9903 Segmental and somatic dysfunction of lumbar region: Secondary | ICD-10-CM | POA: Diagnosis not present

## 2015-05-09 DIAGNOSIS — M9902 Segmental and somatic dysfunction of thoracic region: Secondary | ICD-10-CM | POA: Diagnosis not present

## 2015-05-09 DIAGNOSIS — M9901 Segmental and somatic dysfunction of cervical region: Secondary | ICD-10-CM | POA: Diagnosis not present

## 2015-05-09 DIAGNOSIS — M542 Cervicalgia: Secondary | ICD-10-CM | POA: Diagnosis not present

## 2015-05-15 DIAGNOSIS — H40021 Open angle with borderline findings, high risk, right eye: Secondary | ICD-10-CM | POA: Diagnosis not present

## 2015-05-16 DIAGNOSIS — M542 Cervicalgia: Secondary | ICD-10-CM | POA: Diagnosis not present

## 2015-05-16 DIAGNOSIS — M9903 Segmental and somatic dysfunction of lumbar region: Secondary | ICD-10-CM | POA: Diagnosis not present

## 2015-05-16 DIAGNOSIS — M9901 Segmental and somatic dysfunction of cervical region: Secondary | ICD-10-CM | POA: Diagnosis not present

## 2015-05-16 DIAGNOSIS — M9902 Segmental and somatic dysfunction of thoracic region: Secondary | ICD-10-CM | POA: Diagnosis not present

## 2015-05-18 DIAGNOSIS — M542 Cervicalgia: Secondary | ICD-10-CM | POA: Diagnosis not present

## 2015-05-18 DIAGNOSIS — M9903 Segmental and somatic dysfunction of lumbar region: Secondary | ICD-10-CM | POA: Diagnosis not present

## 2015-05-18 DIAGNOSIS — M9902 Segmental and somatic dysfunction of thoracic region: Secondary | ICD-10-CM | POA: Diagnosis not present

## 2015-05-18 DIAGNOSIS — M9901 Segmental and somatic dysfunction of cervical region: Secondary | ICD-10-CM | POA: Diagnosis not present

## 2015-05-31 DIAGNOSIS — G4733 Obstructive sleep apnea (adult) (pediatric): Secondary | ICD-10-CM | POA: Diagnosis not present

## 2015-07-03 DIAGNOSIS — I10 Essential (primary) hypertension: Secondary | ICD-10-CM | POA: Diagnosis not present

## 2015-07-03 DIAGNOSIS — E7800 Pure hypercholesterolemia, unspecified: Secondary | ICD-10-CM | POA: Diagnosis not present

## 2015-07-03 DIAGNOSIS — Z79899 Other long term (current) drug therapy: Secondary | ICD-10-CM | POA: Diagnosis not present

## 2015-07-03 DIAGNOSIS — E1165 Type 2 diabetes mellitus with hyperglycemia: Secondary | ICD-10-CM | POA: Diagnosis not present

## 2015-08-07 DIAGNOSIS — M1711 Unilateral primary osteoarthritis, right knee: Secondary | ICD-10-CM | POA: Diagnosis not present

## 2015-08-14 DIAGNOSIS — E119 Type 2 diabetes mellitus without complications: Secondary | ICD-10-CM | POA: Diagnosis not present

## 2015-08-14 DIAGNOSIS — H11042 Peripheral pterygium, stationary, left eye: Secondary | ICD-10-CM | POA: Diagnosis not present

## 2015-09-03 DIAGNOSIS — M1711 Unilateral primary osteoarthritis, right knee: Secondary | ICD-10-CM | POA: Diagnosis not present

## 2015-09-06 DIAGNOSIS — M25561 Pain in right knee: Secondary | ICD-10-CM | POA: Diagnosis not present

## 2015-09-26 DIAGNOSIS — Z8742 Personal history of other diseases of the female genital tract: Secondary | ICD-10-CM | POA: Diagnosis not present

## 2015-09-26 DIAGNOSIS — Z1231 Encounter for screening mammogram for malignant neoplasm of breast: Secondary | ICD-10-CM | POA: Diagnosis not present

## 2015-09-26 DIAGNOSIS — Z01419 Encounter for gynecological examination (general) (routine) without abnormal findings: Secondary | ICD-10-CM | POA: Diagnosis not present

## 2015-09-27 DIAGNOSIS — M1711 Unilateral primary osteoarthritis, right knee: Secondary | ICD-10-CM | POA: Diagnosis not present

## 2015-10-04 DIAGNOSIS — M1711 Unilateral primary osteoarthritis, right knee: Secondary | ICD-10-CM | POA: Diagnosis not present

## 2015-10-11 DIAGNOSIS — M1711 Unilateral primary osteoarthritis, right knee: Secondary | ICD-10-CM | POA: Diagnosis not present

## 2015-10-16 DIAGNOSIS — E119 Type 2 diabetes mellitus without complications: Secondary | ICD-10-CM | POA: Diagnosis not present

## 2015-10-16 DIAGNOSIS — E1140 Type 2 diabetes mellitus with diabetic neuropathy, unspecified: Secondary | ICD-10-CM | POA: Diagnosis not present

## 2015-10-16 DIAGNOSIS — E1165 Type 2 diabetes mellitus with hyperglycemia: Secondary | ICD-10-CM | POA: Diagnosis not present

## 2015-10-16 DIAGNOSIS — E7800 Pure hypercholesterolemia, unspecified: Secondary | ICD-10-CM | POA: Diagnosis not present

## 2015-11-15 DIAGNOSIS — M1711 Unilateral primary osteoarthritis, right knee: Secondary | ICD-10-CM | POA: Diagnosis not present

## 2015-11-28 DIAGNOSIS — D1721 Benign lipomatous neoplasm of skin and subcutaneous tissue of right arm: Secondary | ICD-10-CM | POA: Diagnosis not present

## 2015-11-28 DIAGNOSIS — B078 Other viral warts: Secondary | ICD-10-CM | POA: Diagnosis not present

## 2015-11-28 DIAGNOSIS — L570 Actinic keratosis: Secondary | ICD-10-CM | POA: Diagnosis not present

## 2015-11-28 DIAGNOSIS — D1801 Hemangioma of skin and subcutaneous tissue: Secondary | ICD-10-CM | POA: Diagnosis not present

## 2015-11-28 DIAGNOSIS — D2261 Melanocytic nevi of right upper limb, including shoulder: Secondary | ICD-10-CM | POA: Diagnosis not present

## 2015-11-28 DIAGNOSIS — D225 Melanocytic nevi of trunk: Secondary | ICD-10-CM | POA: Diagnosis not present

## 2015-12-10 DIAGNOSIS — G4733 Obstructive sleep apnea (adult) (pediatric): Secondary | ICD-10-CM | POA: Diagnosis not present

## 2015-12-29 ENCOUNTER — Ambulatory Visit (INDEPENDENT_AMBULATORY_CARE_PROVIDER_SITE_OTHER): Payer: BLUE CROSS/BLUE SHIELD | Admitting: Emergency Medicine

## 2015-12-29 ENCOUNTER — Ambulatory Visit (INDEPENDENT_AMBULATORY_CARE_PROVIDER_SITE_OTHER): Payer: BLUE CROSS/BLUE SHIELD

## 2015-12-29 VITALS — BP 132/76 | HR 78 | Temp 97.5°F | Resp 18 | Ht 68.0 in | Wt 248.2 lb

## 2015-12-29 DIAGNOSIS — R05 Cough: Secondary | ICD-10-CM | POA: Diagnosis not present

## 2015-12-29 DIAGNOSIS — J0100 Acute maxillary sinusitis, unspecified: Secondary | ICD-10-CM | POA: Diagnosis not present

## 2015-12-29 DIAGNOSIS — R059 Cough, unspecified: Secondary | ICD-10-CM

## 2015-12-29 DIAGNOSIS — R51 Headache: Secondary | ICD-10-CM | POA: Diagnosis not present

## 2015-12-29 MED ORDER — BENZONATATE 100 MG PO CAPS: 100.0000 mg | ORAL_CAPSULE | Freq: Three times a day (TID) | ORAL | 0 refills | Status: DC | PRN

## 2015-12-29 MED ORDER — FLUTICASONE PROPIONATE 50 MCG/ACT NA SUSP: 2.0000 | Freq: Every day | NASAL | 6 refills | Status: DC

## 2015-12-29 MED ORDER — AZITHROMYCIN 250 MG PO TABS: ORAL_TABLET | ORAL | 0 refills | Status: DC

## 2015-12-29 NOTE — Patient Instructions (Addendum)
     IF you received an x-ray today, you will receive an invoice from [facility]. Please contact [facility] at [phone] with questions or concerns regarding your invoice.   IF you received labwork today, you will receive an invoice from [facility]. Please contact [facility] at [phone] with questions or concerns regarding your invoice.   Our billing staff will not be able to assist you with questions regarding bills from these companies.  You will be contacted with the lab results as soon as they are available. The fastest way to get your results is to activate your My Chart account. Instructions are located on the last page of this paperwork. If you have not heard from us regarding the results in 2 weeks, please contact this office.      Cough, Adult Coughing is a reflex that clears your throat and your airways. Coughing helps to heal and protect your lungs. It is normal to cough occasionally, but a cough that happens with other symptoms or lasts a long time may be a sign of a condition that needs treatment. A cough may last only 2-3 weeks (acute), or it may last longer than 8 weeks (chronic). What are the causes? Coughing is commonly caused by:  Breathing in substances that irritate your lungs.  A viral or bacterial respiratory infection.  Allergies.  Asthma.  Postnasal drip.  Smoking.  Acid backing up from the stomach into the esophagus (gastroesophageal reflux).  Certain medicines.  Chronic lung problems, including COPD (or rarely, lung cancer).  Other medical conditions such as heart failure. Follow these instructions at home: Pay attention to any changes in your symptoms. Take these actions to help with your discomfort:  Take medicines only as told by your health care provider.  If you were prescribed an antibiotic medicine, take it as told by your health care provider. Do not stop taking the antibiotic even if you  start to feel better.  Talk with your health care provider before you take a cough suppressant medicine.  Drink enough fluid to keep your urine clear or pale yellow.  If the air is dry, use a cold steam vaporizer or humidifier in your bedroom or your home to help loosen secretions.  Avoid anything that causes you to cough at work or at home.  If your cough is worse at night, try sleeping in a semi-upright position.  Avoid cigarette smoke. If you smoke, quit smoking. If you need help quitting, ask your health care provider.  Avoid caffeine.  Avoid alcohol.  Rest as needed. Contact a health care provider if:  You have new symptoms.  You cough up pus.  Your cough does not get better after 2-3 weeks, or your cough gets worse.  You cannot control your cough with suppressant medicines and you are losing sleep.  You develop pain that is getting worse or pain that is not controlled with pain medicines.  You have a fever.  You have unexplained weight loss.  You have night sweats. Get help right away if:  You cough up blood.  You have difficulty breathing.  Your heartbeat is very fast. This information is not intended to replace advice given to you by your health care provider. Make sure you discuss any questions you have with your health care provider. Document Released: 07/12/2010 Document Revised: 06/21/2015 Document Reviewed: 03/22/2014 Elsevier Interactive Patient Education  2017 [employer]

## 2015-12-29 NOTE — Progress Notes (Addendum)
By signing my name below, I, Raven Small, attest that this documentation has been prepared under the direction and in the presence of Lesle Chris, MD.  Electronically Signed: Andrew Au, ED Scribe. 12/29/2015. 12:29 PM.  Chief Complaint:  Chief Complaint  Patient presents with  . Cough    green/yellow mucusx 2 wks     HPI: Allison Mcdaniel is a 60 y.o. female who [facility] to [facility] today complaining of productive cough consisting of yellow/green sputum onset 2 weeks. Pt reports symptoms started with allergies. Pt reports coughing fits. She has associated sinus pressure, sinus congestion, postnasal drip and HA. She started flonase 5 days ago.  She denies SOB. Pt is allergies to penicillins; Reports hives with the medication.    Pt has hx of diabetes. She is followed by Johnn Hai. States her sugar are under 150. States she has not been in insulin for long.   Past Medical History:  Diagnosis Date  . Abnormal uterine bleeding   . Amenorrhea   . Arthritis   . ASCUS (atypical squamous cells of undetermined significance) on Pap smear   . Atrophic endometrium   . Blood transfusion without reported diagnosis   . Colon polyp   . Depression   . Diabetes mellitus   . Diverticulosis   . Galactorrhea   . Hyperlipidemia   . Hypertension   . NAFLD (nonalcoholic fatty liver disease)   . OSA on CPAP   . Over weight    Past Surgical History:  Procedure Laterality Date  . COSMETIC SURGERY    . DILATION AND CURETTAGE OF UTERUS    . HYSTEROSCOPY    . JOINT REPLACEMENT    . KNEE ARTHROPLASTY    . ROTATOR CUFF REPAIR     Social History   Social History  . Marital status: Single    Spouse name: N/A  . Number of children: N/A  . Years of education: N/A   Occupational History  . physical therapist    Social History Main Topics  . Smoking status: Never Smoker  . Smokeless tobacco: Never Used  . Alcohol use 0.0 oz/week     Comment: 1 or less  . Drug use: No  . Sexual activity: No     Other Topics Concern  . None   Social History Narrative  . None   Family History  Problem Relation Age of Onset  . Hypertension Mother   . Depression Mother   . Hypertension Father   . Heart disease Father   . Stroke Maternal Grandmother   . Hypertension Maternal Grandmother   . Heart disease Paternal Grandmother   . Hypertension Paternal Grandmother   . Heart disease Paternal Grandfather   . Hypertension Paternal Grandfather   . Hypertension Maternal Grandfather    Allergies  Allergen Reactions  . Bextra [Valdecoxib]   . Penicillins    Prior to Admission medications   Medication Sig Start Date End Date Taking? Authorizing Provider  aspirin 81 MG tablet Take 81 mg by mouth daily.   Yes Historical Provider, MD  cetirizine (ZYRTEC) 10 MG tablet Take 10 mg by mouth daily.   Yes Historical Provider, MD  co-enzyme Q-10 30 MG capsule Take 30 mg by mouth 3 (three) times daily.   Yes Historical Provider, MD  cyclobenzaprine (FLEXERIL) 10 MG tablet Take 10 mg by mouth 3 (three) times daily as needed for muscle spasms.   Yes Historical Provider, MD  fenofibrate (TRICOR) 145 MG tablet TAKE 1 TABLET  BY MOUTH EVERY DAY 09/25/14  Yes Chelle Jeffery, PA-C  fish oil-omega-3 fatty acids 1000 MG capsule Take 2 g by mouth daily.   Yes Historical Provider, MD  glucose blood test strip Use as instructed 06/16/13  Yes Maurice MarchBarbara B McPherson, MD  insulin lispro (HUMALOG) 100 UNIT/ML injection Inject 30 Units into the skin 3 (three) times daily - between meals and at bedtime.   Yes Historical Provider, MD  lisinopril-hydrochlorothiazide (PRINZIDE,ZESTORETIC) 10-12.5 MG per tablet TAKE 1 TABLET BY MOUTH DAILY. 06/16/13  Yes Maurice MarchBarbara B McPherson, MD  meloxicam (MOBIC) 15 MG tablet Take 15 mg by mouth daily.   Yes Historical Provider, MD  metFORMIN (GLUCOPHAGE-XR) 500 MG 24 hr tablet Take 2 tablets (1,000 mg total) by mouth 2 (two) times daily. PATIENT NEEDS OFFICE VISIT FOR ADDITIONAL REFILLS 06/29/14  Yes  Collie SiadStephanie D English, PA  metoprolol tartrate (LOPRESSOR) 25 MG tablet TAKE 1 TABLET (25 MG TOTAL) BY MOUTH 2 (TWO) TIMES DAILY. 06/16/13  Yes Maurice MarchBarbara B McPherson, MD  milk thistle 175 MG tablet Take 175 mg by mouth daily.   Yes Historical Provider, MD  Vitamin D, Ergocalciferol, (DRISDOL) 50000 units CAPS capsule Take 50,000 Units by mouth every 7 (seven) days.   Yes Historical Provider, MD  vitamin E 400 UNIT capsule Take 400 Units by mouth daily.   Yes Historical Provider, MD  OVER THE COUNTER MEDICATION OTC Celery Seed Extract taking daily    Historical Provider, MD  Red Yeast Rice Extract (RED YEAST RICE PO) Take by mouth.    Historical Provider, MD  tobramycin (TOBREX) 0.3 % ophthalmic solution Place 1 drop into the left eye every 4 (four) hours. Patient not taking: Reported on 12/29/2015 04/29/14   Elvina SidleKurt Lauenstein, MD     ROS: The patient denies fevers, chills, night sweats, unintentional weight loss, chest pain, palpitations, wheezing, dyspnea on exertion, nausea, vomiting, abdominal pain, dysuria, hematuria, melena, numbness, weakness, or tingling.   All other systems have been reviewed and were otherwise negative with the exception of those mentioned in the HPI and as above.    PHYSICAL EXAM: Vitals:   12/29/15 1223  BP: 132/76  Pulse: 78  Resp: 18  Temp: 97.5 F (36.4 C)   Body mass index is 37.74 kg/m.   General: Alert, no acute distress HEENT:  Normocephalic, atraumatic, oropharynx patent. significant nasal congestion.  Eye: Nonie HoyerOMI, [facility] Cardiovascular:  Regular rate and rhythm, no rubs murmurs or gallops.  No Carotid bruits, radial pulse intact. No pedal edema.  Respiratory: Clear to auscultation bilaterally.  No wheezes, rales, or rhonchi.  No cyanosis, no use of accessory musculature. frequent episode of cough.  Abdominal: No organomegaly, abdomen is soft and non-tender, positive bowel sounds.  No masses. Musculoskeletal: Gait intact. No edema, tenderness Skin: No  rashes. Neurologic: Facial musculature symmetric. Psychiatric: Patient acts appropriately throughout our interaction. Lymphatic: No cervical or submandibular lymphadenopathy   ASSESSMENT/PLAN: We'll treat with Flonase, Z-Pak, and Tessalon Perles. She is encouraged force fluids.I personally performed the services described in this documentation, which was scribed in my presence. The recorded information has been reviewed and is accurate. There is a minimal amount of fluid in the base of the left maxillary sinus. There is a clip left zygoma. Still awaiting radiology report.I personally performed the services described in this documentation, which was scribed in my presence. The recorded information has been reviewed and is accurate.   Gross sideeffects, risk and benefits, and alternatives of medications d/w patient. Patient is aware that all  medications have potential sideeffects and we are unable to predict every sideeffect or drug-drug interaction that may occur.  Lesle ChrisSteven Chalene Treu MD 12/29/2015 12:28 PM

## 2016-01-02 DIAGNOSIS — I10 Essential (primary) hypertension: Secondary | ICD-10-CM | POA: Diagnosis not present

## 2016-01-02 DIAGNOSIS — R319 Hematuria, unspecified: Secondary | ICD-10-CM | POA: Diagnosis not present

## 2016-01-29 DIAGNOSIS — I499 Cardiac arrhythmia, unspecified: Secondary | ICD-10-CM | POA: Diagnosis not present

## 2016-01-29 DIAGNOSIS — Z79899 Other long term (current) drug therapy: Secondary | ICD-10-CM | POA: Diagnosis not present

## 2016-01-29 DIAGNOSIS — Z7984 Long term (current) use of oral hypoglycemic drugs: Secondary | ICD-10-CM | POA: Diagnosis not present

## 2016-01-29 DIAGNOSIS — Z0000 Encounter for general adult medical examination without abnormal findings: Secondary | ICD-10-CM | POA: Diagnosis not present

## 2016-01-29 DIAGNOSIS — Z87440 Personal history of urinary (tract) infections: Secondary | ICD-10-CM | POA: Diagnosis not present

## 2016-01-29 DIAGNOSIS — E7800 Pure hypercholesterolemia, unspecified: Secondary | ICD-10-CM | POA: Diagnosis not present

## 2016-01-29 DIAGNOSIS — E1165 Type 2 diabetes mellitus with hyperglycemia: Secondary | ICD-10-CM | POA: Diagnosis not present

## 2016-01-29 DIAGNOSIS — E559 Vitamin D deficiency, unspecified: Secondary | ICD-10-CM | POA: Diagnosis not present

## 2016-02-08 ENCOUNTER — Other Ambulatory Visit: Payer: Self-pay | Admitting: Orthopedic Surgery

## 2016-02-19 ENCOUNTER — Ambulatory Visit (HOSPITAL_COMMUNITY)
Admission: RE | Admit: 2016-02-19 | Discharge: 2016-02-19 | Disposition: A | Discharge status: Home / Self Care | Payer: BLUE CROSS/BLUE SHIELD | Source: Ambulatory Visit | Attending: Orthopedic Surgery | Admitting: Orthopedic Surgery

## 2016-02-19 ENCOUNTER — Encounter (HOSPITAL_COMMUNITY): Payer: Self-pay

## 2016-02-19 ENCOUNTER — Encounter (HOSPITAL_COMMUNITY)
Admission: RE | Admit: 2016-02-19 | Discharge: 2016-02-19 | Disposition: A | Discharge status: Home / Self Care | Payer: BLUE CROSS/BLUE SHIELD | Source: Ambulatory Visit | Attending: Orthopedic Surgery | Admitting: Orthopedic Surgery

## 2016-02-19 DIAGNOSIS — Z01818 Encounter for other preprocedural examination: Secondary | ICD-10-CM

## 2016-02-19 HISTORY — DX: Other specified postprocedural states: Z98.890

## 2016-02-19 HISTORY — DX: Other specified postprocedural states: R11.2

## 2016-02-19 LAB — BASIC METABOLIC PANEL
Anion gap: 9 (ref 5–15)
BUN: 12 mg/dL (ref 6–20)
CHLORIDE: 101 mmol/L (ref 101–111)
CO2: 27 mmol/L (ref 22–32)
CREATININE: 0.97 mg/dL (ref 0.44–1.00)
Calcium: 10 mg/dL (ref 8.9–10.3)
GFR calc Af Amer: >60 mL/min (ref >60)
GLUCOSE: 112 mg/dL — AB (ref 65–99)
POTASSIUM: 3.9 mmol/L (ref 3.5–5.1)
SODIUM: 137 mmol/L (ref 135–145)

## 2016-02-19 LAB — CBC WITH DIFFERENTIAL/PLATELET
Basophils Absolute: 0 10*3/uL (ref 0.0–0.1)
Basophils Relative: 0 %
EOS ABS: 0.2 10*3/uL (ref 0.0–0.7)
EOS PCT: 2 %
HCT: 38.7 % (ref 36.0–46.0)
Hemoglobin: 12.6 g/dL (ref 12.0–15.0)
LYMPHS ABS: 2.9 10*3/uL (ref 0.7–4.0)
LYMPHS PCT: 40 %
MCH: 29.1 pg (ref 26.0–34.0)
MCHC: 32.6 g/dL (ref 30.0–36.0)
MCV: 89.4 fL (ref 78.0–100.0)
MONO ABS: 0.5 10*3/uL (ref 0.1–1.0)
Monocytes Relative: 7 %
Neutro Abs: 3.5 10*3/uL (ref 1.7–7.7)
Neutrophils Relative %: 49 %
PLATELETS: 300 10*3/uL (ref 150–400)
RBC: 4.33 MIL/uL (ref 3.87–5.11)
RDW: 13.4 % (ref 11.5–15.5)
WBC: 7.2 10*3/uL (ref 4.0–10.5)

## 2016-02-19 LAB — URINALYSIS, ROUTINE W REFLEX MICROSCOPIC
Bilirubin Urine: NEGATIVE
Glucose, UA: NEGATIVE mg/dL
Hgb urine dipstick: NEGATIVE
Ketones, ur: NEGATIVE mg/dL
LEUKOCYTES UA: NEGATIVE
NITRITE: NEGATIVE
PH: 7 (ref 5.0–8.0)
Protein, ur: NEGATIVE mg/dL
SPECIFIC GRAVITY, URINE: 1.004 — AB (ref 1.005–1.030)

## 2016-02-19 LAB — TYPE AND SCREEN
ABO/RH(D): AB POS
Antibody Screen: NEGATIVE

## 2016-02-19 LAB — APTT: APTT: 28 s (ref 24–36)

## 2016-02-19 LAB — GLUCOSE, CAPILLARY: Glucose-Capillary: 112 mg/dL — ABNORMAL HIGH (ref 65–99)

## 2016-02-19 LAB — SURGICAL PCR SCREEN
MRSA, PCR: NEGATIVE
STAPHYLOCOCCUS AUREUS: NEGATIVE

## 2016-02-19 LAB — PROTIME-INR
INR: 1.05
PROTHROMBIN TIME: 13.7 s (ref 11.4–15.2)

## 2016-02-19 LAB — ABO/RH: ABO/RH(D): AB POS

## 2016-02-19 NOTE — Progress Notes (Signed)
PCP - Mila PalmerSharon Wolters - requesting records Cardiologist - denies  Chest x-ray - 02/19/16 EKG - requesting from PCP Stress Test - denies ECHO - denies Cardiac Cath - denies Sleep Study - yes   Fasting Blood Sugar - 130-150s Checks Blood Sugar __2/3___ times a day  CPAP - wears at night instructed to bring with her  Will send to anesthesia for review of records requested  Patient denies shortness of breath, fever, cough and chest pain at PAT appointment   Patient verbalized understanding of instructions that was given to them at the PAT appointment. Patient expressed that there were no further questions.  Patient was also instructed that they will need to review over the PAT instructions again at home before the surgery.

## 2016-02-19 NOTE — Pre-Procedure Instructions (Signed)
Allison Mcdaniel  02/19/2016      [id number] - [place], [place] - [address] AT [facility] [address] [place] [address] Phone: [phone] Fax: [phone]  [facility] [address], [place] - [address] [place] [address] Phone: [phone] Fax: [phone]    Your procedure is scheduled on January 31  Report to [facility] Admitting at 1045 A.M.  Call this number if you have problems the morning of surgery:  [phone]   Remember:  Do not eat food or drink liquids after midnight.   Take these medicines the morning of surgery with A SIP OF WATER cetirizine (ZYRTEC), metoprolol tartrate (LOPRESSOR), eye drops  7 days prior to surgery STOP taking any meloxicam (MOBIC),  Aspirin, Aleve, Naproxen, Ibuprofen, Motrin, Advil, Goody's, BC's, all herbal medications, fish oil, and all vitamins    WHAT DO I DO ABOUT MY DIABETES MEDICATION?   Marland Kitchen Do not take oral diabetes medicines (pills) the morning of surgery. metFORMIN (GLUCOPHAGE-XR)   Insulin Lispro Prot & Lispro (HUMALOG MIX 75/25 KWIKPEN [place]) only take 21 units the day before surgery twice a day  DO NOT TAKE Insulin Lispro Prot & Lispro (HUMALOG MIX 75/25 KWIKPEN ) the morning of surgery   How to Manage Your Diabetes Before and After Surgery  Why is it important to control my blood sugar before and after surgery? . Improving blood sugar levels before and after surgery helps healing and can limit problems. . A way of improving blood sugar control is eating a healthy diet by: o  Eating less sugar and carbohydrates o  Increasing activity/exercise o  Talking with your doctor about reaching your blood sugar goals . High blood sugars (greater than 180 mg/dL) can raise your risk of infections and slow your recovery, so you will need to focus on controlling your diabetes during the weeks before surgery. . Make sure that the  doctor who takes care of your diabetes knows about your planned surgery including the date and location.  How do I manage my blood sugar before surgery? . Check your blood sugar at least 4 times a day, starting 2 days before surgery, to make sure that the level is not too high or low. o Check your blood sugar the morning of your surgery when you wake up and every 2 hours until you get to the Short Stay unit. . If your blood sugar is less than 70 mg/dL, you will need to treat for low blood sugar: o Do not take insulin. o Treat a low blood sugar (less than 70 mg/dL) with  cup of clear juice (cranberry or apple), 4 glucose tablets, OR glucose gel. o Recheck blood sugar in 15 minutes after treatment (to make sure it is greater than 70 mg/dL). If your blood sugar is not greater than 70 mg/dL on recheck, call [phone] for further instructions. . Report your blood sugar to the short stay nurse when you get to Short Stay.  . If you are admitted to the hospital after surgery: o Your blood sugar will be checked by the staff and you will probably be given insulin after surgery (instead of oral diabetes medicines) to make sure you have good blood sugar levels. o The goal for blood sugar control after surgery is 80-180 mg/dL.    Do not wear jewelry, make-up or nail polish.  Do not wear lotions, powders, or perfumes, or deoderant.  Do not shave 48 hours prior to surgery.  Men may shave face and neck.  Do not bring valuables to the hospital.  [facility] is not responsible for any belongings or valuables.  Contacts, dentures or bridgework may not be worn into surgery.  Leave your suitcase in the car.  After surgery it may be brought to your room.  For patients admitted to the hospital, discharge time will be determined by your treatment team.  Patients discharged the day of surgery will not be allowed to drive home.    Special instructions:   [facility]- Preparing For Surgery  Before surgery,  you can play an important role. Because skin is not sterile, your skin needs to be as free of germs as possible. You can reduce the number of germs on your skin by washing with CHG (chlorahexidine gluconate) Soap before surgery.  CHG is an antiseptic cleaner which kills germs and bonds with the skin to continue killing germs even after washing.  Please do not use if you have an allergy to CHG or antibacterial soaps. If your skin becomes reddened/irritated stop using the CHG.  Do not shave (including legs and underarms) for at least 48 hours prior to first CHG shower. It is OK to shave your face.  Please follow these instructions carefully.   1. Shower the NIGHT BEFORE SURGERY and the MORNING OF SURGERY with CHG.   2. If you chose to wash your hair, wash your hair first as usual with your normal shampoo.  3. After you shampoo, rinse your hair and body thoroughly to remove the shampoo.  4. Use CHG as you would any other liquid soap. You can apply CHG directly to the skin and wash gently with a scrungie or a clean washcloth.   5. Apply the CHG Soap to your body ONLY FROM THE NECK DOWN.  Do not use on open wounds or open sores. Avoid contact with your eyes, ears, mouth and genitals (private parts). Wash genitals (private parts) with your normal soap.  6. Wash thoroughly, paying special attention to the area where your surgery will be performed.  7. Thoroughly rinse your body with warm water from the neck down.  8. DO NOT shower/wash with your normal soap after using and rinsing off the CHG Soap.  9. Pat yourself dry with a CLEAN TOWEL.   10. Wear CLEAN PAJAMAS   11. Place CLEAN SHEETS on your bed the night of your first shower and DO NOT SLEEP WITH PETS.    Day of Surgery: Do not apply any deodorants/lotions. Please wear clean clothes to the hospital/surgery center.      Please read over the following fact sheets that you were given.

## 2016-02-22 DIAGNOSIS — M1711 Unilateral primary osteoarthritis, right knee: Secondary | ICD-10-CM | POA: Diagnosis present

## 2016-02-26 MED ORDER — BUPIVACAINE LIPOSOME 1.3 % IJ SUSP
20.0000 mL | INTRAMUSCULAR | Status: AC
  Administered 2016-02-27: 20 mL
  Filled 2016-02-26: qty 20

## 2016-02-26 MED ORDER — VANCOMYCIN HCL 10 G IV SOLR
1500.0000 mg | INTRAVENOUS | Status: AC
  Administered 2016-02-27: 1500 mg via INTRAVENOUS
  Filled 2016-02-26: qty 1500

## 2016-02-26 MED ORDER — DEXTROSE-NACL 5-0.45 % IV SOLN: INTRAVENOUS | Status: DC

## 2016-02-26 MED ORDER — TRANEXAMIC ACID 1000 MG/10ML IV SOLN
2000.0000 mg | INTRAVENOUS | Status: AC
  Administered 2016-02-27: 2000 mg via TOPICAL
  Filled 2016-02-26: qty 20
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
## 2016-02-27 ENCOUNTER — Inpatient Hospital Stay (HOSPITAL_COMMUNITY): Payer: BLUE CROSS/BLUE SHIELD | Admitting: Vascular Surgery

## 2016-02-27 ENCOUNTER — Encounter (HOSPITAL_COMMUNITY)
Admission: RE | Disposition: A | Discharge status: Home / Self Care | Payer: Self-pay | Source: Ambulatory Visit | Attending: Orthopedic Surgery

## 2016-02-27 ENCOUNTER — Inpatient Hospital Stay (HOSPITAL_COMMUNITY): Payer: BLUE CROSS/BLUE SHIELD | Admitting: Certified Registered Nurse Anesthetist

## 2016-02-27 ENCOUNTER — Observation Stay (HOSPITAL_COMMUNITY)
Admission: RE | Admit: 2016-02-27 | Discharge: 2016-02-28 | Disposition: A | Discharge status: Home / Self Care | Payer: BLUE CROSS/BLUE SHIELD | Source: Ambulatory Visit | Attending: Orthopedic Surgery | Admitting: Orthopedic Surgery

## 2016-02-27 ENCOUNTER — Encounter (HOSPITAL_COMMUNITY): Payer: Self-pay | Admitting: Certified Registered Nurse Anesthetist

## 2016-02-27 DIAGNOSIS — F329 Major depressive disorder, single episode, unspecified: Secondary | ICD-10-CM | POA: Diagnosis not present

## 2016-02-27 DIAGNOSIS — Z7984 Long term (current) use of oral hypoglycemic drugs: Secondary | ICD-10-CM | POA: Insufficient documentation

## 2016-02-27 DIAGNOSIS — I10 Essential (primary) hypertension: Secondary | ICD-10-CM | POA: Diagnosis not present

## 2016-02-27 DIAGNOSIS — G4733 Obstructive sleep apnea (adult) (pediatric): Secondary | ICD-10-CM | POA: Diagnosis not present

## 2016-02-27 DIAGNOSIS — E119 Type 2 diabetes mellitus without complications: Secondary | ICD-10-CM | POA: Insufficient documentation

## 2016-02-27 DIAGNOSIS — E785 Hyperlipidemia, unspecified: Secondary | ICD-10-CM | POA: Diagnosis not present

## 2016-02-27 DIAGNOSIS — Z888 Allergy status to other drugs, medicaments and biological substances status: Secondary | ICD-10-CM | POA: Diagnosis not present

## 2016-02-27 DIAGNOSIS — K760 Fatty (change of) liver, not elsewhere classified: Secondary | ICD-10-CM | POA: Diagnosis not present

## 2016-02-27 DIAGNOSIS — G8918 Other acute postprocedural pain: Secondary | ICD-10-CM | POA: Diagnosis not present

## 2016-02-27 DIAGNOSIS — M1711 Unilateral primary osteoarthritis, right knee: Secondary | ICD-10-CM | POA: Diagnosis not present

## 2016-02-27 DIAGNOSIS — Z7982 Long term (current) use of aspirin: Secondary | ICD-10-CM | POA: Insufficient documentation

## 2016-02-27 DIAGNOSIS — Z86010 Personal history of colonic polyps: Secondary | ICD-10-CM | POA: Diagnosis not present

## 2016-02-27 DIAGNOSIS — Z8719 Personal history of other diseases of the digestive system: Secondary | ICD-10-CM | POA: Diagnosis not present

## 2016-02-27 DIAGNOSIS — Z880 Allergy status to penicillin: Secondary | ICD-10-CM | POA: Insufficient documentation

## 2016-02-27 HISTORY — PX: TOTAL KNEE ARTHROPLASTY: SHX125

## 2016-02-27 LAB — GLUCOSE, CAPILLARY
Glucose-Capillary: 165 mg/dL — ABNORMAL HIGH (ref 65–99)
Glucose-Capillary: 108 mg/dL — ABNORMAL HIGH (ref 65–99)
Glucose-Capillary: 138 mg/dL — ABNORMAL HIGH (ref 65–99)
Glucose-Capillary: 220 mg/dL — ABNORMAL HIGH (ref 65–99)

## 2016-02-27 SURGERY — ARTHROPLASTY, KNEE, TOTAL
Anesthesia: Spinal | Site: Knee | Laterality: Right

## 2016-02-27 MED ORDER — HYDROMORPHONE HCL 2 MG/ML IJ SOLN
1.0000 mg | INTRAMUSCULAR | Status: DC | PRN
  Administered 2016-02-27 – 2016-02-28 (×2): 1 mg via INTRAVENOUS
  Filled 2016-02-27 (×2): qty 1

## 2016-02-27 MED ORDER — PROMETHAZINE HCL 25 MG/ML IJ SOLN
INTRAMUSCULAR | Status: AC
  Filled 2016-02-27: qty 1

## 2016-02-27 MED ORDER — SODIUM CHLORIDE 0.9 % IJ SOLN
INTRAMUSCULAR | Status: DC | PRN
  Administered 2016-02-27: 50 mL

## 2016-02-27 MED ORDER — SENNOSIDES-DOCUSATE SODIUM 8.6-50 MG PO TABS: 1.0000 | ORAL_TABLET | Freq: Every evening | ORAL | Status: DC | PRN

## 2016-02-27 MED ORDER — CEFUROXIME SODIUM 750 MG IJ SOLR
INTRAMUSCULAR | Status: AC
  Filled 2016-02-27: qty 1500

## 2016-02-27 MED ORDER — KCL IN DEXTROSE-NACL 20-5-0.45 MEQ/L-%-% IV SOLN
INTRAVENOUS | Status: DC
  Administered 2016-02-27 – 2016-02-28 (×2): 125 mL/h via INTRAVENOUS
  Filled 2016-02-27 (×2): qty 1000

## 2016-02-27 MED ORDER — OXYCODONE HCL 5 MG PO TABS
5.0000 mg | ORAL_TABLET | ORAL | Status: DC | PRN
  Administered 2016-02-27: 10 mg via ORAL
  Administered 2016-02-28: 5 mg via ORAL
  Administered 2016-02-28 (×2): 10 mg via ORAL
  Administered 2016-02-28: 5 mg via ORAL
  Administered 2016-02-28: 10 mg via ORAL
  Administered 2016-02-28: 5 mg via ORAL
  Filled 2016-02-27 (×3): qty 1
  Filled 2016-02-27 (×4): qty 2

## 2016-02-27 MED ORDER — METHOCARBAMOL 500 MG PO TABS: 500.0000 mg | ORAL_TABLET | Freq: Two times a day (BID) | ORAL | 0 refills | Status: DC

## 2016-02-27 MED ORDER — FLEET ENEMA 7-19 GM/118ML RE ENEM: 1.0000 | ENEMA | Freq: Once | RECTAL | Status: DC | PRN

## 2016-02-27 MED ORDER — PHENOL 1.4 % MT LIQD: 1.0000 | OROMUCOSAL | Status: DC | PRN

## 2016-02-27 MED ORDER — LACTATED RINGERS IV SOLN
INTRAVENOUS | Status: DC
  Administered 2016-02-27 (×2): via INTRAVENOUS

## 2016-02-27 MED ORDER — MIDAZOLAM HCL 2 MG/2ML IJ SOLN
2.0000 mg | Freq: Once | INTRAMUSCULAR | Status: AC
  Administered 2016-02-27: 2 mg via INTRAVENOUS

## 2016-02-27 MED ORDER — ACETAMINOPHEN 10 MG/ML IV SOLN
INTRAVENOUS | Status: AC
  Filled 2016-02-27: qty 100

## 2016-02-27 MED ORDER — ACETAMINOPHEN 650 MG RE SUPP: 650.0000 mg | Freq: Four times a day (QID) | RECTAL | Status: DC | PRN

## 2016-02-27 MED ORDER — APIXABAN 2.5 MG PO TABS: 2.5000 mg | ORAL_TABLET | Freq: Two times a day (BID) | ORAL | 0 refills | Status: DC

## 2016-02-27 MED ORDER — ALUM & MAG HYDROXIDE-SIMETH 200-200-20 MG/5ML PO SUSP: 30.0000 mL | ORAL | Status: DC | PRN

## 2016-02-27 MED ORDER — PROMETHAZINE HCL 25 MG/ML IJ SOLN
6.2500 mg | INTRAMUSCULAR | Status: DC | PRN
  Administered 2016-02-27: 6.25 mg via INTRAVENOUS

## 2016-02-27 MED ORDER — BUPIVACAINE HCL (PF) 0.25 % IJ SOLN
INTRAMUSCULAR | Status: DC | PRN
  Administered 2016-02-27 (×2): 30 mL

## 2016-02-27 MED ORDER — EPINEPHRINE PF 1 MG/ML IJ SOLN
INTRAMUSCULAR | Status: AC
  Filled 2016-02-27: qty 1

## 2016-02-27 MED ORDER — INSULIN ASPART PROT & ASPART (70-30 MIX) 100 UNIT/ML ~~LOC~~ SUSP
30.0000 [IU] | Freq: Two times a day (BID) | SUBCUTANEOUS | Status: DC
  Administered 2016-02-27 – 2016-02-28 (×2): 30 [IU] via SUBCUTANEOUS
  Filled 2016-02-27 (×2): qty 10

## 2016-02-27 MED ORDER — DIPHENHYDRAMINE HCL 50 MG/ML IJ SOLN
INTRAMUSCULAR | Status: DC | PRN
  Administered 2016-02-27: 12.5 mg via INTRAVENOUS

## 2016-02-27 MED ORDER — ACETAMINOPHEN 10 MG/ML IV SOLN
INTRAVENOUS | Status: DC | PRN
  Administered 2016-02-27: 1000 mg via INTRAVENOUS

## 2016-02-27 MED ORDER — HYDROCHLOROTHIAZIDE 12.5 MG PO CAPS
12.5000 mg | ORAL_CAPSULE | Freq: Every day | ORAL | Status: DC
  Administered 2016-02-28: 12.5 mg via ORAL
  Filled 2016-02-27: qty 1

## 2016-02-27 MED ORDER — BUPIVACAINE IN DEXTROSE 0.75-8.25 % IT SOLN
INTRATHECAL | Status: DC | PRN
  Administered 2016-02-27: 2 mL via INTRATHECAL

## 2016-02-27 MED ORDER — EPINEPHRINE PF 1 MG/ML IJ SOLN
INTRAMUSCULAR | Status: DC | PRN
  Administered 2016-02-27: .3 mL

## 2016-02-27 MED ORDER — HYDROMORPHONE HCL 1 MG/ML IJ SOLN
INTRAMUSCULAR | Status: AC
  Filled 2016-02-27: qty 0.5

## 2016-02-27 MED ORDER — PROPOFOL 1000 MG/100ML IV EMUL
INTRAVENOUS | Status: AC
  Filled 2016-02-27: qty 100

## 2016-02-27 MED ORDER — LIDOCAINE 2% (20 MG/ML) 5 ML SYRINGE
INTRAMUSCULAR | Status: DC | PRN
Start: 2016-02-27 — End: 2016-02-27
  Administered 2016-02-27: 50 mg via INTRAVENOUS

## 2016-02-27 MED ORDER — FENTANYL CITRATE (PF) 100 MCG/2ML IJ SOLN
INTRAMUSCULAR | Status: AC
  Filled 2016-02-27: qty 2

## 2016-02-27 MED ORDER — METOCLOPRAMIDE HCL 5 MG/ML IJ SOLN: 5.0000 mg | Freq: Three times a day (TID) | INTRAMUSCULAR | Status: DC | PRN

## 2016-02-27 MED ORDER — HYDROMORPHONE HCL 1 MG/ML IJ SOLN
0.2500 mg | INTRAMUSCULAR | Status: DC | PRN
  Administered 2016-02-27: 0.5 mg via INTRAVENOUS

## 2016-02-27 MED ORDER — INSULIN ASPART 100 UNIT/ML ~~LOC~~ SOLN
0.0000 [IU] | Freq: Three times a day (TID) | SUBCUTANEOUS | Status: DC
  Administered 2016-02-28 (×2): 5 [IU] via SUBCUTANEOUS

## 2016-02-27 MED ORDER — METOCLOPRAMIDE HCL 5 MG PO TABS: 5.0000 mg | ORAL_TABLET | Freq: Three times a day (TID) | ORAL | Status: DC | PRN

## 2016-02-27 MED ORDER — LORATADINE 10 MG PO TABS
10.0000 mg | ORAL_TABLET | Freq: Every day | ORAL | Status: DC
  Administered 2016-02-28: 10 mg via ORAL
  Filled 2016-02-27: qty 1

## 2016-02-27 MED ORDER — GABAPENTIN 300 MG PO CAPS
300.0000 mg | ORAL_CAPSULE | Freq: Three times a day (TID) | ORAL | Status: DC
  Administered 2016-02-27 – 2016-02-28 (×3): 300 mg via ORAL
  Filled 2016-02-27 (×3): qty 1

## 2016-02-27 MED ORDER — APIXABAN 2.5 MG PO TABS
2.5000 mg | ORAL_TABLET | Freq: Two times a day (BID) | ORAL | Status: DC
  Administered 2016-02-28: 2.5 mg via ORAL
  Filled 2016-02-27: qty 1

## 2016-02-27 MED ORDER — ONDANSETRON HCL 4 MG/2ML IJ SOLN: 4.0000 mg | Freq: Four times a day (QID) | INTRAMUSCULAR | Status: DC | PRN

## 2016-02-27 MED ORDER — SODIUM CHLORIDE 0.9 % IR SOLN
Status: DC | PRN
  Administered 2016-02-27: 1000 mL
  Administered 2016-02-27: 3000 mL

## 2016-02-27 MED ORDER — LISINOPRIL-HYDROCHLOROTHIAZIDE 10-12.5 MG PO TABS: 1.0000 | ORAL_TABLET | Freq: Every day | ORAL | Status: DC

## 2016-02-27 MED ORDER — FENOFIBRATE 160 MG PO TABS
160.0000 mg | ORAL_TABLET | Freq: Every day | ORAL | Status: DC
  Administered 2016-02-28: 160 mg via ORAL
  Filled 2016-02-27: qty 1

## 2016-02-27 MED ORDER — PHENYLEPHRINE HCL 10 MG/ML IJ SOLN
INTRAMUSCULAR | Status: DC | PRN
  Administered 2016-02-27 (×2): 80 ug via INTRAVENOUS

## 2016-02-27 MED ORDER — PROPOFOL 10 MG/ML IV BOLUS
INTRAVENOUS | Status: DC | PRN
  Administered 2016-02-27 (×4): 20 mg via INTRAVENOUS

## 2016-02-27 MED ORDER — CHLORHEXIDINE GLUCONATE 4 % EX LIQD: 60.0000 mL | Freq: Once | CUTANEOUS | Status: DC

## 2016-02-27 MED ORDER — ONDANSETRON HCL 4 MG PO TABS: 4.0000 mg | ORAL_TABLET | Freq: Four times a day (QID) | ORAL | Status: DC | PRN

## 2016-02-27 MED ORDER — DIPHENHYDRAMINE HCL 12.5 MG/5ML PO ELIX
12.5000 mg | ORAL_SOLUTION | ORAL | Status: DC | PRN
  Administered 2016-02-28: 25 mg via ORAL
  Filled 2016-02-27: qty 10

## 2016-02-27 MED ORDER — BUPIVACAINE HCL (PF) 0.25 % IJ SOLN
INTRAMUSCULAR | Status: AC
  Filled 2016-02-27: qty 60

## 2016-02-27 MED ORDER — PROPOFOL 500 MG/50ML IV EMUL
INTRAVENOUS | Status: DC | PRN
  Administered 2016-02-27: 75 ug/kg/min via INTRAVENOUS

## 2016-02-27 MED ORDER — METFORMIN HCL ER 500 MG PO TB24
1000.0000 mg | ORAL_TABLET | Freq: Two times a day (BID) | ORAL | Status: DC
  Administered 2016-02-28 (×2): 1000 mg via ORAL
  Filled 2016-02-27 (×2): qty 2

## 2016-02-27 MED ORDER — OXYCODONE-ACETAMINOPHEN 5-325 MG PO TABS: 1.0000 | ORAL_TABLET | ORAL | 0 refills | Status: DC | PRN

## 2016-02-27 MED ORDER — MIDAZOLAM HCL 2 MG/2ML IJ SOLN
INTRAMUSCULAR | Status: AC
  Filled 2016-02-27: qty 2

## 2016-02-27 MED ORDER — METHOCARBAMOL 1000 MG/10ML IJ SOLN
500.0000 mg | Freq: Four times a day (QID) | INTRAVENOUS | Status: DC | PRN
  Filled 2016-02-27: qty 5

## 2016-02-27 MED ORDER — MENTHOL 3 MG MT LOZG: 1.0000 | LOZENGE | OROMUCOSAL | Status: DC | PRN

## 2016-02-27 MED ORDER — DOCUSATE SODIUM 100 MG PO CAPS
100.0000 mg | ORAL_CAPSULE | Freq: Two times a day (BID) | ORAL | Status: DC
  Administered 2016-02-27 – 2016-02-28 (×2): 100 mg via ORAL
  Filled 2016-02-27 (×2): qty 1

## 2016-02-27 MED ORDER — FENTANYL CITRATE (PF) 100 MCG/2ML IJ SOLN
50.0000 ug | Freq: Once | INTRAMUSCULAR | Status: AC
  Administered 2016-02-27: 50 ug via INTRAVENOUS

## 2016-02-27 MED ORDER — CEFUROXIME SODIUM 750 MG IJ SOLR
INTRAMUSCULAR | Status: DC | PRN
  Administered 2016-02-27 (×2): 750 mg

## 2016-02-27 MED ORDER — METOPROLOL TARTRATE 25 MG PO TABS
37.5000 mg | ORAL_TABLET | Freq: Two times a day (BID) | ORAL | Status: DC
  Administered 2016-02-27 – 2016-02-28 (×2): 37.5 mg via ORAL
  Filled 2016-02-27 (×2): qty 1

## 2016-02-27 MED ORDER — ONDANSETRON HCL 4 MG/2ML IJ SOLN
INTRAMUSCULAR | Status: DC | PRN
  Administered 2016-02-27: 4 mg via INTRAVENOUS

## 2016-02-27 MED ORDER — ACETAMINOPHEN 325 MG PO TABS
650.0000 mg | ORAL_TABLET | Freq: Four times a day (QID) | ORAL | Status: DC | PRN
  Administered 2016-02-28: 650 mg via ORAL
  Filled 2016-02-27: qty 2

## 2016-02-27 MED ORDER — BISACODYL 5 MG PO TBEC: 5.0000 mg | DELAYED_RELEASE_TABLET | Freq: Every day | ORAL | Status: DC | PRN

## 2016-02-27 MED ORDER — LISINOPRIL 10 MG PO TABS
10.0000 mg | ORAL_TABLET | Freq: Every day | ORAL | Status: DC
  Administered 2016-02-28: 10 mg via ORAL
  Filled 2016-02-27: qty 1

## 2016-02-27 MED ORDER — METHOCARBAMOL 500 MG PO TABS
500.0000 mg | ORAL_TABLET | Freq: Four times a day (QID) | ORAL | Status: DC | PRN
  Administered 2016-02-27 – 2016-02-28 (×4): 500 mg via ORAL
  Filled 2016-02-27 (×5): qty 1

## 2016-02-27 MED ORDER — PHENYLEPHRINE HCL 10 MG/ML IJ SOLN
INTRAVENOUS | Status: DC | PRN
  Administered 2016-02-27: 25 ug/min via INTRAVENOUS

## 2016-02-27 SURGICAL SUPPLY — 61 items
BANDAGE ESMARK 6X9 LF (GAUZE/BANDAGES/DRESSINGS) ×1 IMPLANT
BLADE SAG 18X100X1.27 (BLADE) ×2 IMPLANT
BLADE SAW SGTL 13X75X1.27 (BLADE) ×2 IMPLANT
BNDG CMPR 9X6 STRL LF SNTH (GAUZE/BANDAGES/DRESSINGS) ×1
BNDG CMPR MED 10X6 ELC LF (GAUZE/BANDAGES/DRESSINGS) ×1
BNDG ELASTIC 6X10 VLCR STRL LF (GAUZE/BANDAGES/DRESSINGS) ×2 IMPLANT
BNDG ESMARK 6X9 LF (GAUZE/BANDAGES/DRESSINGS) ×2
BOWL SMART MIX CTS (DISPOSABLE) ×2 IMPLANT
CAPT KNEE TOTAL 3 ATTUNE ×1 IMPLANT
CEMENT HV SMART SET (Cement) ×4 IMPLANT
COVER SURGICAL LIGHT HANDLE (MISCELLANEOUS) ×2 IMPLANT
CUFF TOURNIQUET SINGLE 34IN LL (TOURNIQUET CUFF) ×2 IMPLANT
CUFF TOURNIQUET SINGLE 44IN (TOURNIQUET CUFF) IMPLANT
DRAPE EXTREMITY T 121X128X90 (DRAPE) ×2 IMPLANT
DRAPE U-SHAPE 47X51 STRL (DRAPES) ×2 IMPLANT
DRSG AQUACEL AG ADV 3.5X10 (GAUZE/BANDAGES/DRESSINGS) ×2 IMPLANT
DRSG AQUACEL AG ADV 3.5X14 (GAUZE/BANDAGES/DRESSINGS) ×1 IMPLANT
DURAPREP 26ML APPLICATOR (WOUND CARE) ×3 IMPLANT
ELECT REM PT RETURN 9FT ADLT (ELECTROSURGICAL) ×2
ELECTRODE REM PT RTRN 9FT ADLT (ELECTROSURGICAL) ×1 IMPLANT
FILTER STRAW FLUID ASPIR (MISCELLANEOUS) ×1 IMPLANT
GLOVE BIO SURGEON STRL SZ 6.5 (GLOVE) ×1 IMPLANT
GLOVE BIO SURGEON STRL SZ7.5 (GLOVE) ×2 IMPLANT
GLOVE BIO SURGEON STRL SZ8.5 (GLOVE) ×2 IMPLANT
GLOVE BIOGEL PI IND STRL 6.5 (GLOVE) IMPLANT
GLOVE BIOGEL PI IND STRL 8 (GLOVE) ×1 IMPLANT
GLOVE BIOGEL PI IND STRL 9 (GLOVE) ×1 IMPLANT
GLOVE BIOGEL PI INDICATOR 6.5 (GLOVE) ×4
GLOVE BIOGEL PI INDICATOR 8 (GLOVE) ×1
GLOVE BIOGEL PI INDICATOR 9 (GLOVE) ×1
GLOVE ECLIPSE 6.5 STRL STRAW (GLOVE) ×1 IMPLANT
GOWN STRL REUS W/ TWL LRG LVL3 (GOWN DISPOSABLE) ×1 IMPLANT
GOWN STRL REUS W/ TWL XL LVL3 (GOWN DISPOSABLE) ×2 IMPLANT
GOWN STRL REUS W/TWL LRG LVL3 (GOWN DISPOSABLE) ×4
GOWN STRL REUS W/TWL XL LVL3 (GOWN DISPOSABLE) ×2
HANDPIECE INTERPULSE COAX TIP (DISPOSABLE) ×2
HOOD PEEL AWAY FACE SHEILD DIS (HOOD) ×5 IMPLANT
KIT BASIN OR (CUSTOM PROCEDURE TRAY) ×2 IMPLANT
KIT ROOM TURNOVER OR (KITS) ×2 IMPLANT
MANIFOLD NEPTUNE II (INSTRUMENTS) ×2 IMPLANT
NDL HYPO 25GX1X1/2 BEV (NEEDLE) IMPLANT
NEEDLE 22X1 1/2 (OR ONLY) (NEEDLE) ×5 IMPLANT
NEEDLE HYPO 25GX1X1/2 BEV (NEEDLE) IMPLANT
NS IRRIG 1000ML POUR BTL (IV SOLUTION) ×2 IMPLANT
PACK TOTAL JOINT (CUSTOM PROCEDURE TRAY) ×2 IMPLANT
PAD ARMBOARD 7.5X6 YLW CONV (MISCELLANEOUS) ×4 IMPLANT
SET HNDPC FAN SPRY TIP SCT (DISPOSABLE) ×1 IMPLANT
SUT VIC AB 0 CT1 27 (SUTURE) ×2
SUT VIC AB 0 CT1 27XBRD ANBCTR (SUTURE) ×1 IMPLANT
SUT VIC AB 1 CTX 36 (SUTURE) ×4
SUT VIC AB 1 CTX36XBRD ANBCTR (SUTURE) ×1 IMPLANT
SUT VIC AB 2-0 CT1 27 (SUTURE) ×4
SUT VIC AB 2-0 CT1 TAPERPNT 27 (SUTURE) ×1 IMPLANT
SUT VIC AB 3-0 CT1 27 (SUTURE) ×4
SUT VIC AB 3-0 CT1 TAPERPNT 27 (SUTURE) ×1 IMPLANT
SYR CONTROL 10ML LL (SYRINGE) ×4 IMPLANT
SYR TB 1ML LUER SLIP (SYRINGE) ×1 IMPLANT
TOWEL OR 17X24 6PK STRL BLUE (TOWEL DISPOSABLE) ×2 IMPLANT
TOWEL OR 17X26 10 PK STRL BLUE (TOWEL DISPOSABLE) ×2 IMPLANT
TRAY CATH 16FR W/PLASTIC CATH (SET/KITS/TRAYS/PACK) ×1 IMPLANT
YANKAUER SUCT BULB TIP NO VENT (SUCTIONS) ×1 IMPLANT

## 2016-02-27 NOTE — Anesthesia Procedure Notes (Signed)
Procedure Name: MAC Date/Time: 02/27/2016 12:25 PM Performed by: Candis Shine Pre-anesthesia Checklist: Patient identified, Emergency Drugs available, Suction available, Patient being monitored and Timeout performed Patient Re-evaluated:Patient Re-evaluated prior to inductionOxygen Delivery Method: Simple face mask Dental Injury: Teeth and Oropharynx as per pre-operative assessment

## 2016-02-27 NOTE — OR Nursing (Signed)
Dr. Turner Danielsowan aware of Penicillin allergy and wanted to use zinacef to mix with bone cement anyway.

## 2016-02-27 NOTE — Discharge Instructions (Addendum)

## 2016-02-27 NOTE — Evaluation (Signed)
Physical Therapy Evaluation Patient Details Name: Allison Mcdaniel MRN: [id number] DOB: 1955/07/17 Today's Date: 02/27/2016   History of Present Illness  61 y.o. female admitted to [facility] on 02/27/16 for elective R TKA.  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  Clinical Impression  Pt is POD #0 and is moving very well.  Min guard assist to supervision overall with RW.  HEP education and precaution education initiated.  She will likely progress well enough to d/c home after AM session tomorrow if she is medically ready (if MD deems appropriate).   PT to follow acutely for deficits listed below.       Follow Up Recommendations Home health PT;Supervision for mobility/OOB    Equipment Recommendations  Rolling walker with 5" wheels    Recommendations for Other Services   NA     Precautions / Restrictions Precautions Precautions: Knee Precaution Booklet Issued: Yes (comment) Precaution Comments: knee exercise handout given and no pillow under surgical knee reviewed Restrictions Weight Bearing Restrictions: Yes RLE Weight Bearing: Weight bearing as tolerated      Mobility  Bed Mobility Overal bed mobility: Modified Independent             General bed mobility comments: Pt able to get to EOB unassisted, HOB elevated, able to lift leg against gravity  Transfers Overall transfer level: Needs assistance Equipment used: Rolling walker (2 wheeled) Transfers: Sit to/from Stand Sit to Stand: Min guard         General transfer comment: Min guard assist for safety during transitions.   Ambulation/Gait Ambulation/Gait assistance: Min guard;Supervision Ambulation Distance (Feet): 250 Feet Assistive device: Rolling walker (2 wheeled) Gait Pattern/deviations: Step-through pattern;Antalgic     General Gait Details: Pt with moderately antalgic gait pattern, no buckling.  RW adjusted up for height.  She was able to progress to supervision.           Balance Overall balance assessment: Needs assistance Sitting-balance support: Feet supported;No upper extremity supported Sitting balance-Leahy Scale: Good     Standing balance support: No upper extremity supported;Bilateral upper extremity supported;Single extremity supported Standing balance-Leahy Scale: Fair                               Pertinent Vitals/Pain Pain Assessment: Faces Faces Pain Scale: Hurts little more Pain Location: right knee Pain Descriptors / Indicators: Aching;Burning Pain Intervention(s): Limited activity within patient's tolerance;Monitored during session;Repositioned    Home Living Family/patient expects to be discharged to:: Private residence     Type of Home: House Home Access: Stairs to enter Entrance Stairs-Rails: Right;Left;Can reach both Secretary/administrator of Steps: 4 Home Layout: One level        Prior Function Level of Independence: Independent                  Extremity/Trunk Assessment   Upper Extremity Assessment Upper Extremity Assessment: Defer to OT evaluation    Lower Extremity Assessment Lower Extremity Assessment: RLE deficits/detail RLE Deficits / Details: right leg with normal post op pain and weakness, ankle at least 3/5, knee 2/5, hip flexion 3-/5 per gross functional assessment.  RLE Sensation:  (intact to LT)    Cervical / Trunk Assessment Cervical / Trunk Assessment: Normal  Communication   Communication: No difficulties  Cognition Arousal/Alertness: Awake/alert Behavior During Therapy: WFL for tasks assessed/performed Overall Cognitive Status: Within Functional Limits for tasks assessed  Exercises Total Joint Exercises Ankle Circles/Pumps: AROM;Both;20 reps Quad Sets: AROM;Right;10 reps Towel Squeeze: AROM;Both;10 reps Heel Slides: AROM;Right;10 reps   Assessment/Plan    PT Assessment Patient needs continued PT services  PT Problem List Decreased  strength;Decreased range of motion;Decreased activity tolerance;Decreased balance;Decreased mobility;Decreased knowledge of use of DME;Decreased knowledge of precautions;Pain          PT Treatment Interventions DME instruction;Gait training;Stair training;Functional mobility training;Therapeutic activities;Therapeutic exercise;Balance training;Patient/family education;Manual techniques;Modalities    PT Goals (Current goals can be found in the Care Plan section)  Acute Rehab PT Goals Patient Stated Goal: to go home as soon as she is allowed PT Goal Formulation: With patient Time For Goal Achievement: 03/05/16 Potential to Achieve Goals: Good    Frequency 7X/week           End of Session   Activity Tolerance: Patient limited by pain Patient left: in chair;with call bell/phone within reach;with family/visitor present           Time: 1610-9604 PT Time Calculation (min) (ACUTE ONLY): 21 min   Charges:   PT Evaluation $PT Eval Moderate Complexity: 1 Procedure          Tasmia Blumer B. Alyah Boehning, PT, DPT [phone]   02/27/2016, 6:27 PM

## 2016-02-27 NOTE — Op Note (Signed)
PATIENT ID:      Allison Mcdaniel  MRN:     [id number] DOB/AGE:    Jan 17, 1956 / 61 y.o.       OPERATIVE REPORT    DATE OF PROCEDURE:  02/27/2016       PREOPERATIVE DIAGNOSIS:   RIGHT KNEE OSTEOARTHRITIS      Estimated body mass index is 37.25 kg/m as calculated from the following:   Height as of 02/19/16: 5\' 8"  (1.727 m).   Weight as of this encounter: 111.1 kg (245 lb).                                                        POSTOPERATIVE DIAGNOSIS:   RIGHT KNEE OSTEOARTHRITIS                                                                      PROCEDURE:  Procedure(s): RIGHT TOTAL KNEE ARTHROPLASTY Using DepuyAttune RP implants #6R Femur, #6Tibia, 5 mm Attune RP bearing, 41 Patella     SURGEON: Sigourney Portillo J    ASSISTANT:   Eric K. [employer]   (Present and scrubbed throughout the case, critical for assistance with exposure, retraction, instrumentation, and closure.)         ANESTHESIA: Spinal, 20cc Exparel, 50cc 0.25% Marcaine  EBL: 300  FLUID REPLACEMENT: 1500 crystalloid  TOURNIQUET TIME:  Drains: None  Tranexamic Acid: 1gm IV, 2gm topical  COMPLICATIONS:  None         INDICATIONS FOR PROCEDURE: The patient has  RIGHT KNEE OSTEOARTHRITIS, Var deformities, XR shows bone on bone arthritis, lateral subluxation of tibia. Patient has failed all conservative measures including anti-inflammatory medicines, narcotics, attempts at  exercise and weight loss, cortisone injections and viscosupplementation.  Risks and benefits of surgery have been discussed, questions answered.   DESCRIPTION OF PROCEDURE: The patient identified by armband, received  IV antibiotics, in the holding area at [facility]. Patient taken to the operating room, appropriate anesthetic  monitors were attached, and Spinal anesthesia was  induced. Tourniquet  applied high to the operative thigh. Lateral post and foot positioner  applied to the table, the lower extremity was then prepped and draped  in  usual sterile fashion from the toes to the tourniquet. Time-out procedure was performed. We began the operation, with the knee flexed 120 degrees, by making the anterior midline incision starting at handbreadth above the patella going over the patella 1 cm medial to and 4 cm distal to the tibial tubercle. Small bleeders in the skin and the  subcutaneous tissue identified and cauterized. Transverse retinaculum was incised and reflected medially and a medial parapatellar arthrotomy was accomplished. the patella was everted and theprepatellar fat pad resected. The superficial medial collateral  ligament was then elevated from anterior to posterior along the proximal  flare of the tibia and anterior half of the menisci resected. The knee was hyperflexed exposing bone on bone arthritis. Peripheral and notch osteophytes as well as the cruciate ligaments were then resected. We continued to  work our way around posteriorly along the proximal tibia,  and externally  rotated the tibia subluxing it out from underneath the femur. A McHale  retractor was placed through the notch and a lateral Hohmann retractor  placed, and we then drilled through the proximal tibia in line with the  axis of the tibia followed by an intramedullary guide rod and 2-degree  posterior slope cutting guide. The tibial cutting guide, 3 degree posterior sloped, was pinned into place allowing resection of 3 mm of bone medially and 9 mm of bone laterally. Satisfied with the tibial resection, we then  entered the distal femur 2 mm anterior to the PCL origin with the  intramedullary guide rod and applied the distal femoral cutting guide  set at 9 mm, with 5 degrees of valgus. This was pinned along the  epicondylar axis. At this point, the distal femoral cut was accomplished without difficulty. We then sized for a #6R femoral component and pinned the guide in 3 degrees of external rotation. The chamfer cutting guide was pinned into place. The  anterior, posterior, and chamfer cuts were accomplished without difficulty followed by  the Attune RP box cutting guide and the box cut. We also removed posterior osteophytes from the posterior femoral condyles. At this  time, the knee was brought into full extension. We checked our  extension and flexion gaps and found them symmetric for a 5 mm bearing. Distracting in extension with a lamina spreader, the posterior horns of the menisci were removed, and Exparel, diluted to 60 cc, with 20cc NS, and 20cc 0.5% Marcaine,was injected into the capsule and synovium of the knee. The posterior patella cut was accomplished with the 9.5 mm Attune cutting guide, sized for a 41mm dome, and the fixation pegs drilled.The knee  was then once again hyperflexed exposing the proximal tibia. We sized for a # 6 tibial base plate, applied the smokestack and the conical reamer followed by the the Delta fin keel punch. We then hammered into place the Attune RP trial femoral component, drilled the lugs, inserted a  5 mm trial bearing, trial patellar button, and took the knee through range of motion from 0-130 degrees. No thumb pressure was required for patellar Tracking. At this point, the limb was wrapped with an Esmarch bandage and the tourniquet inflated to 350 mmHg. All trial components were removed, mating surfaces irrigated with pulse lavage, and dried with suction and sponges. 10 cc of the Exparel solution was applied to the cancellus bone of the patella distal femur and proximal tibia.  After waiting 1 minute, the bony surfaces were again, dried with sponges. A double batch of DePuy HV cement with 1500 mg of Zinacef was mixed and applied to all bony metallic mating surfaces except for the posterior condyles of the femur itself. In order, we hammered into place the tibial tray and removed excess cement, the femoral component and removed excess cement. The final Attune RP bearing  was inserted, and the knee brought to full  extension with compression.  The patellar button was clamped into place, and excess cement  removed. While the cement cured the wound was irrigated out with normal saline solution pulse lavage. Ligament stability and patellar tracking were checked and found to be excellent. The parapatellar arthrotomy was closed with  running #1 Vicryl suture. The subcutaneous tissue with 0 and 2-0 undyed  Vicryl suture, and the skin with running 3-0 SQ vicryl. A dressing of Xeroform,  4 x 4, dressing sponges, Webril, and Ace wrap applied. The patient  awakened, and taken  to recovery room without difficulty.   Gean Birchwood J 02/27/2016, 2:17 PM

## 2016-02-27 NOTE — Anesthesia Postprocedure Evaluation (Addendum)
Anesthesia Post Note  Patient: Allison Mcdaniel  Procedure(s) Performed: Procedure(s) (LRB): RIGHT TOTAL KNEE ARTHROPLASTY (Right)  Patient location during evaluation: PACU Anesthesia Type: Spinal Level of consciousness: oriented and awake and alert Pain management: pain level controlled Vital Signs Assessment: post-procedure vital signs reviewed and stable Respiratory status: spontaneous breathing, respiratory function stable and patient connected to nasal cannula oxygen Cardiovascular status: blood pressure returned to baseline and stable Postop Assessment: no headache and no backache Anesthetic complications: no       Last Vitals:  Vitals:   02/27/16 1445 02/27/16 1542  BP:  137/81  Pulse: 65 66  Resp: (!) 21 15  Temp:      Last Pain:  Vitals:   02/27/16 1034  TempSrc:   PainSc: 0-No pain                 Omair Dettmer S

## 2016-02-27 NOTE — Interval H&P Note (Signed)
History and Physical Interval Note:  02/27/2016 11:34 AM  Allison Mcdaniel  has presented today for surgery, with the diagnosis of RIGHT KNEE OSTEOARTHRITIS  The various methods of treatment have been discussed with the patient and family. After consideration of risks, benefits and other options for treatment, the patient has consented to  Procedure(s): TOTAL KNEE ARTHROPLASTY (Right) as a surgical intervention .  The patient's history has been reviewed, patient examined, no change in status, stable for surgery.  I have reviewed the patient's chart and labs.  Questions were answered to the patient's satisfaction.     Nestor LewandowskyOWAN,Syler Norcia J

## 2016-02-27 NOTE — Progress Notes (Signed)
Orthopedic Tech Progress Note Patient Details:  Pablo LedgerJoyce C Puopolo [birth year] [id number]  Ortho Devices Ortho Device/Splint Location: footsie roll Ortho Device/Splint Interventions: Ordered, Application   Jennye MoccasinHughes, Yurianna Tusing Craig 02/27/2016, 5:05 PM

## 2016-02-27 NOTE — OR Nursing (Signed)
No orthopedic bed available for patient to be placed onto post-op.  Patient placed onto a stretcher and brought to the pacu.

## 2016-02-27 NOTE — Anesthesia Procedure Notes (Addendum)
Anesthesia Regional Block:  Adductor canal block  Pre-Anesthetic Checklist: ,, timeout performed, Correct Patient, Correct Site, Correct Laterality, Correct Procedure, Correct Position, site marked, Risks and benefits discussed,  Surgical consent,  Pre-op evaluation,  At surgeon's request and post-op pain management  Laterality: Right  Prep: chloraprep       Needles:  Injection technique: Single-shot  Needle Type: Echogenic Needle     Needle Length: 9cm 9 cm Needle Gauge: 21 G    Additional Needles:  Procedures: ultrasound guided (picture in chart) Adductor canal block Narrative:  Start time: 02/27/2016 11:05 AM End time: 02/27/2016 11:16 AM Injection made incrementally with aspirations every 5 mL.  Performed by: Personally  Anesthesiologist: Gennette Shadix  Additional Notes: Patient tolerated the procedure well without complications

## 2016-02-27 NOTE — Anesthesia Preprocedure Evaluation (Signed)
Anesthesia Evaluation  Patient identified by MRN, date of birth, ID band Patient awake    Reviewed: Allergy & Precautions, NPO status , Patient's Chart, lab work & pertinent test results  History of Anesthesia Complications (+) PONV  Airway Mallampati: II  TM Distance: >3 FB Neck ROM: Full    Dental no notable dental hx.    Pulmonary sleep apnea ,    Pulmonary exam normal breath sounds clear to auscultation       Cardiovascular hypertension, Normal cardiovascular exam Rhythm:Regular Rate:Normal     Neuro/Psych negative neurological ROS  negative psych ROS   GI/Hepatic negative GI ROS, Neg liver ROS,   Endo/Other  diabetes, Insulin Dependent  Renal/GU negative Renal ROS  negative genitourinary   Musculoskeletal negative musculoskeletal ROS (+)   Abdominal   Peds negative pediatric ROS (+)  Hematology negative hematology ROS (+)   Anesthesia Other Findings   Reproductive/Obstetrics negative OB ROS                             Anesthesia Physical Anesthesia Plan  ASA: III  Anesthesia Plan: Spinal   Post-op Pain Management:    Induction: Intravenous  Airway Management Planned: Simple Face Mask  Additional Equipment:   Intra-op Plan:   Post-operative Plan:   Informed Consent: I have reviewed the patients History and Physical, chart, labs and discussed the procedure including the risks, benefits and alternatives for the proposed anesthesia with the patient or authorized representative who has indicated his/her understanding and acceptance.   Dental advisory given  Plan Discussed with: CRNA and Surgeon  Anesthesia Plan Comments:         Anesthesia Quick Evaluation

## 2016-02-27 NOTE — Anesthesia Procedure Notes (Signed)
Spinal  Patient location during procedure: OR Start time: 02/27/2016 12:35 PM End time: 02/27/2016 12:40 PM Staffing Anesthesiologist: Aidan Moten, Iona Beard Performed: anesthesiologist  Preanesthetic Checklist Completed: patient identified, site marked, surgical consent, pre-op evaluation, timeout performed, IV checked, risks and benefits discussed and monitors and equipment checked Spinal Block Patient position: sitting Prep: Betadine Patient monitoring: heart rate, continuous pulse ox and blood pressure Injection technique: single-shot Needle Needle type: Sprotte  Needle gauge: 24 G Needle length: 9 cm Additional Notes Expiration date of kit checked and confirmed. Patient tolerated procedure well, without complications.

## 2016-02-27 NOTE — Transfer of Care (Signed)
Immediate Anesthesia Transfer of Care Note  Patient: Allison Mcdaniel  Procedure(s) Performed: Procedure(s): RIGHT TOTAL KNEE ARTHROPLASTY (Right)  Patient Location: PACU  Anesthesia Type:General  Level of Consciousness: awake, alert  and oriented  Airway & Oxygen Therapy: Patient Spontanous Breathing and Patient connected to face mask oxygen  Post-op Assessment: Report given to RN and Post -op Vital signs reviewed and stable  Post vital signs: Reviewed and stable  Last Vitals:  Vitals:   02/27/16 0951  BP: (!) 152/71  Pulse: 69  Resp: 20  Temp: 37 C    Last Pain:  Vitals:   02/27/16 1034  TempSrc:   PainSc: 0-No pain      Patients Stated Pain Goal: 4 (02/27/16 1034)  Complications: No apparent anesthesia complications

## 2016-02-28 ENCOUNTER — Encounter (HOSPITAL_COMMUNITY): Payer: Self-pay | Admitting: Orthopedic Surgery

## 2016-02-28 DIAGNOSIS — Z86010 Personal history of colonic polyps: Secondary | ICD-10-CM | POA: Diagnosis not present

## 2016-02-28 DIAGNOSIS — K760 Fatty (change of) liver, not elsewhere classified: Secondary | ICD-10-CM | POA: Diagnosis not present

## 2016-02-28 DIAGNOSIS — E119 Type 2 diabetes mellitus without complications: Secondary | ICD-10-CM | POA: Diagnosis not present

## 2016-02-28 DIAGNOSIS — R269 Unspecified abnormalities of gait and mobility: Secondary | ICD-10-CM | POA: Diagnosis not present

## 2016-02-28 DIAGNOSIS — Z7984 Long term (current) use of oral hypoglycemic drugs: Secondary | ICD-10-CM | POA: Diagnosis not present

## 2016-02-28 DIAGNOSIS — E785 Hyperlipidemia, unspecified: Secondary | ICD-10-CM | POA: Diagnosis not present

## 2016-02-28 DIAGNOSIS — M1711 Unilateral primary osteoarthritis, right knee: Secondary | ICD-10-CM | POA: Diagnosis not present

## 2016-02-28 DIAGNOSIS — F329 Major depressive disorder, single episode, unspecified: Secondary | ICD-10-CM | POA: Diagnosis not present

## 2016-02-28 DIAGNOSIS — Z8719 Personal history of other diseases of the digestive system: Secondary | ICD-10-CM | POA: Diagnosis not present

## 2016-02-28 DIAGNOSIS — M1990 Unspecified osteoarthritis, unspecified site: Secondary | ICD-10-CM | POA: Diagnosis not present

## 2016-02-28 DIAGNOSIS — Z888 Allergy status to other drugs, medicaments and biological substances status: Secondary | ICD-10-CM | POA: Diagnosis not present

## 2016-02-28 DIAGNOSIS — Z880 Allergy status to penicillin: Secondary | ICD-10-CM | POA: Diagnosis not present

## 2016-02-28 DIAGNOSIS — G4733 Obstructive sleep apnea (adult) (pediatric): Secondary | ICD-10-CM | POA: Diagnosis not present

## 2016-02-28 DIAGNOSIS — I10 Essential (primary) hypertension: Secondary | ICD-10-CM | POA: Diagnosis not present

## 2016-02-28 DIAGNOSIS — Z7982 Long term (current) use of aspirin: Secondary | ICD-10-CM | POA: Diagnosis not present

## 2016-02-28 LAB — GLUCOSE, CAPILLARY
GLUCOSE-CAPILLARY: 241 mg/dL — AB (ref 65–99)
Glucose-Capillary: 229 mg/dL — ABNORMAL HIGH (ref 65–99)
Glucose-Capillary: 229 mg/dL — ABNORMAL HIGH (ref 65–99)
Glucose-Capillary: 201 mg/dL — ABNORMAL HIGH (ref 65–99)

## 2016-02-28 LAB — CBC
HEMATOCRIT: 32.6 % — AB (ref 36.0–46.0)
Hemoglobin: 10.7 g/dL — ABNORMAL LOW (ref 12.0–15.0)
MCH: 29.2 pg (ref 26.0–34.0)
MCHC: 32.8 g/dL (ref 30.0–36.0)
MCV: 89.1 fL (ref 78.0–100.0)
PLATELETS: 260 10*3/uL (ref 150–400)
RBC: 3.66 MIL/uL — ABNORMAL LOW (ref 3.87–5.11)
RDW: 13.5 % (ref 11.5–15.5)
WBC: 10.2 10*3/uL (ref 4.0–10.5)

## 2016-02-28 LAB — BASIC METABOLIC PANEL
Anion gap: 9 (ref 5–15)
BUN: 10 mg/dL (ref 6–20)
CALCIUM: 8.8 mg/dL — AB (ref 8.9–10.3)
CO2: 25 mmol/L (ref 22–32)
CREATININE: 0.99 mg/dL (ref 0.44–1.00)
Chloride: 100 mmol/L — ABNORMAL LOW (ref 101–111)
GFR calc Af Amer: >60 mL/min (ref >60)
GFR calc non Af Amer: 60 mL/min — ABNORMAL LOW (ref >60)
Glucose, Bld: 213 mg/dL — ABNORMAL HIGH (ref 65–99)
Potassium: 4.1 mmol/L (ref 3.5–5.1)
Sodium: 134 mmol/L — ABNORMAL LOW (ref 135–145)

## 2016-02-28 LAB — HEMOGLOBIN A1C
Hgb A1c MFr Bld: 6.6 % — ABNORMAL HIGH (ref 4.8–5.6)
Mean Plasma Glucose: 143 mg/dL

## 2016-02-28 NOTE — Progress Notes (Signed)
Physical Therapy Treatment Patient Details Name: Allison Mcdaniel MRN: [id number] DOB: [birth year] Today's Date: 02/28/2016    History of Present Illness 61 y.o. female admitted to [facility] on 02/27/16 for elective R TKA.  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    PT Comments    Pt continues to have increased c/o discomfort limiting progression of ROM activities. Pt is, however, able to perform increased gait distance this session. ROM measurements are taken and noted before. Pt will benefit from continued skilled PT services once she returns home.    Follow Up Recommendations  Home health PT;Supervision for mobility/OOB     Equipment Recommendations  Rolling walker with 5" wheels    Recommendations for Other Services       Precautions / Restrictions Precautions Precautions: Knee Precaution Booklet Issued: Yes (comment) Precaution Comments: knee exercise handout given and no pillow under surgical knee reviewed Restrictions Weight Bearing Restrictions: Yes RLE Weight Bearing: Weight bearing as tolerated    Mobility  Bed Mobility               General bed mobility comments: OOB in recliner when PT arrives  Transfers Overall transfer level: Needs assistance Equipment used: Rolling walker (2 wheeled) Transfers: Sit to/from Stand Sit to Stand: Min guard         General transfer comment: Min guard assist for safety during transitions.   Ambulation/Gait Ambulation/Gait assistance: Min guard;Supervision Ambulation Distance (Feet): 200 Feet Assistive device: Rolling walker (2 wheeled) Gait Pattern/deviations: Step-through pattern;Antalgic Gait velocity: decreased Gait velocity interpretation: Below normal speed for age/gender General Gait Details: Pt continues to have Moderate antalgic gait with decreased step length bilaterally.    Stairs Stairs: Yes   Stair Management: Two rails Number of Stairs: 2 General stair comments: Cues  for sequencing  Wheelchair Mobility    Modified Rankin (Stroke Patients Only)       Balance Overall balance assessment: Needs assistance Sitting-balance support: Feet supported;No upper extremity supported Sitting balance-Leahy Scale: Good     Standing balance support: No upper extremity supported;Bilateral upper extremity supported;Single extremity supported Standing balance-Leahy Scale: Fair                      Cognition Arousal/Alertness: Awake/alert Behavior During Therapy: WFL for tasks assessed/performed Overall Cognitive Status: Within Functional Limits for tasks assessed                      Exercises Total Joint Exercises Ankle Circles/Pumps: AROM;Both;20 reps Quad Sets: AROM;Right;10 reps Knee Flexion: AROM;Right;10 reps;Seated Goniometric ROM: 6-65    General Comments        Pertinent Vitals/Pain Pain Assessment: 0-10 Pain Score: 6  Pain Location: right knee Pain Descriptors / Indicators: Aching;Burning Pain Intervention(s): Monitored during session;Premedicated before session;Ice applied;Limited activity within patient's tolerance    Home Living                      Prior Function            PT Goals (current goals can now be found in the care plan section) Acute Rehab PT Goals Patient Stated Goal: to go home as soon as she is allowed Progress towards PT goals: Progressing toward goals    Frequency    7X/week      PT Plan Current plan remains appropriate    Co-evaluation  End of Session Equipment Utilized During Treatment: Gait belt Activity Tolerance: Patient limited by pain Patient left: in chair;with call bell/phone within reach;with family/visitor present     Time: 8295-6213 PT Time Calculation (min) (ACUTE ONLY): 40 min  Charges:  $Gait Training: 8-22 mins $Therapeutic Exercise: 8-22 mins $Therapeutic Activity: 8-22 mins                    G Codes:      Colin Broach PT, DPT   [phone]  02/28/2016, 4:01 PM

## 2016-02-28 NOTE — Discharge Summary (Signed)
Patient ID: Allison Mcdaniel MRN: [id number] DOB/AGE: 06/21/1955 61 y.o.  Admit date: 02/27/2016 Discharge date: 02/28/2016  Admission Diagnoses:  Principal Problem:   Primary osteoarthritis of right knee Active Problems:   Localized osteoarthritis of right knee   Discharge Diagnoses:  Same  Past Medical History:  Diagnosis Date  . Abnormal uterine bleeding   . Amenorrhea   . Arthritis   . ASCUS (atypical squamous cells of undetermined significance) on Pap smear   . Atrophic endometrium   . Blood transfusion without reported diagnosis   . Colon polyp   . Depression   . Diabetes mellitus    type 2  . Diverticulosis   . Galactorrhea    history very remote in her 17s  . Hyperlipidemia   . Hypertension   . NAFLD (nonalcoholic fatty liver disease)   . OSA on CPAP   . Over weight   . PONV (postoperative nausea and vomiting)     Surgeries: Procedure(s): RIGHT TOTAL KNEE ARTHROPLASTY on 02/27/2016   Consultants:   Discharged Condition: Improved  Hospital Course: Allison Mcdaniel is an 61 y.o. female who was admitted 02/27/2016 for operative treatment ofPrimary osteoarthritis of right knee. Patient has severe unremitting pain that affects sleep, daily activities, and work/hobbies. After pre-op clearance the patient was taken to the operating room on 02/27/2016 and underwent  Procedure(s): RIGHT TOTAL KNEE ARTHROPLASTY.    Patient was given perioperative antibiotics: Anti-infectives    Start     Dose/Rate Route Frequency Ordered Stop   02/27/16 1345  cefUROXime (ZINACEF) injection  Status:  Discontinued       As needed 02/27/16 1345 02/27/16 1436   02/27/16 1100  vancomycin (VANCOCIN) 1,500 mg in sodium chloride 0.9 % 500 mL IVPB     1,500 mg 250 mL/hr over 120 Minutes Intravenous To ShortStay Surgical 02/26/16 1446 02/27/16 1225       Patient was given sequential compression devices, early ambulation, and chemoprophylaxis to prevent DVT.  Patient benefited maximally from  hospital stay and there were no complications.    Recent vital signs: Patient Vitals for the past 24 hrs:  BP Temp Temp src Pulse Resp SpO2  02/28/16 0400 (!) 152/70 99.3 F (37.4 C) Oral 81 17 97 %  02/28/16 0005 (!) 151/75 99.4 F (37.4 C) Oral 93 16 98 %  02/27/16 2100 (!) 145/70 99.2 F (37.3 C) Oral 87 16 98 %  02/27/16 1741 139/84 97.5 F (36.4 C) - 76 16 100 %  02/27/16 1700 - 98.1 F (36.7 C) - 70 18 99 %  02/27/16 1645 - - - 72 16 95 %  02/27/16 1641 138/74 - - 71 (!) 23 95 %  02/27/16 1630 - - - 66 17 95 %  02/27/16 1626 (!) 142/77 - - 67 17 100 %  02/27/16 1615 - - - 66 17 100 %  02/27/16 1611 (!) 142/72 - - 67 15 100 %  02/27/16 1556 136/69 - - 68 15 100 %  02/27/16 1542 137/81 - - 66 15 100 %  02/27/16 1530 - - - 63 15 100 %  02/27/16 1515 124/80 - - 65 (!) 32 100 %  02/27/16 1500 (!) 141/72 - - 65 12 100 %  02/27/16 1445 - - - 65 (!) 21 100 %  02/27/16 1443 123/63 97.8 F (36.6 C) - 71 13 100 %     Recent laboratory studies:  Recent Labs  02/28/16 0324  WBC 10.2  HGB 10.7*  HCT  32.6*  PLT 260  NA 134*  K 4.1  CL 100*  CO2 25  BUN 10  CREATININE 0.99  GLUCOSE 213*  CALCIUM 8.8*     Discharge Medications:   Allergies as of 02/28/2016      Reactions   Bextra [valdecoxib] Hives   Penicillins Hives, Itching   About 10-15 years       Medication List    STOP taking these medications   meloxicam 15 MG tablet Commonly known as:  MOBIC     TAKE these medications   apixaban 2.5 MG Tabs tablet Commonly known as:  ELIQUIS Take 1 tablet (2.5 mg total) by mouth 2 (two) times daily.   aspirin 81 MG tablet Take 81 mg by mouth daily.   B-12 (Methylcobalamin) 1000 MCG Subl Place 1 tablet under the tongue daily.   Biotin 32440 MCG Tabs Take 1 tablet by mouth daily.   CALCIUM & VIT D3 BONE HEALTH PO Take 1-2 tablets by mouth See admin instructions. 1 tablet every morning, 2 tablets every evening. Calcium 700 / Vit D 1000   cetirizine 10 MG  tablet Commonly known as:  ZYRTEC Take 10 mg by mouth daily as needed for allergies.   Co Q10 60 MG Caps Take 1 capsule by mouth daily.   CRANBERRY PLUS VITAMIN C PO Take 1 capsule by mouth 2 (two) times daily.   [employer] GLUCOSE PO Take 2 capsules by mouth 2 (two) times daily. GLUCOSE SUPPORT   ELDERBERRY PO Take 1 capsule by mouth 2 (two) times daily. With Vitamin D   fenofibrate 145 MG tablet Commonly known as:  TRICOR TAKE 1 TABLET BY MOUTH EVERY DAY   fish oil-omega-3 fatty acids 1000 MG capsule Take 1 g by mouth 2 (two) times daily.   glucose blood test strip Use as instructed   HUMALOG MIX 75/25 KWIKPEN [place] Inject 30 Units into the skin 2 (two) times daily.   lisinopril-hydrochlorothiazide 10-12.5 MG tablet Commonly known as:  PRINZIDE,ZESTORETIC TAKE 1 TABLET BY MOUTH DAILY.   Magnesium 400 MG Tabs Take 1 tablet by mouth 2 (two) times daily.   metFORMIN 500 MG 24 hr tablet Commonly known as:  GLUCOPHAGE-XR Take 2 tablets (1,000 mg total) by mouth 2 (two) times daily. PATIENT NEEDS OFFICE VISIT FOR ADDITIONAL REFILLS   methocarbamol 500 MG tablet Commonly known as:  ROBAXIN Take 1 tablet (500 mg total) by mouth 2 (two) times daily with a meal.   metoprolol tartrate 25 MG tablet Commonly known as:  LOPRESSOR TAKE 1 TABLET (25 MG TOTAL) BY MOUTH 2 (TWO) TIMES DAILY. What changed:  how much to take  how to take this  when to take this  additional instructions   Milk Thistle 150 MG Caps Take 150 mg by mouth daily.   OVER THE COUNTER MEDICATION Take 2 tablets by mouth daily. GoOut Complex - with celery seed, tart cherry, turmeric   oxyCODONE-acetaminophen 5-325 MG tablet Commonly known as:  ROXICET Take 1-2 tablets by mouth every 4 (four) hours as needed.   simvastatin 20 MG tablet Commonly known as:  ZOCOR Take 20 mg by mouth every evening.   SYSTANE BALANCE OP Place 1 drop into both eyes 2 (two) times daily.   Turmeric Curcumin Caps Take 1  capsule by mouth 2 (two) times daily. CURAMIN   Vitamin D3 5000 units Tabs Take 1 tablet by mouth daily.   vitamin E 400 UNIT capsule Take 400 Units by mouth 2 (two) times daily.  Durable Medical Equipment        Start     Ordered   02/27/16 1720  DME Walker rolling  Once    Question:  Patient needs a walker to treat with the following condition  Answer:  Localized osteoarthritis of right knee   02/27/16 1719   02/27/16 1720  DME 3 n 1  Once     02/27/16 1719   02/27/16 1720  DME Bedside commode  Once    Question:  Patient needs a bedside commode to treat with the following condition  Answer:  Localized osteoarthritis of right knee   02/27/16 1719      Diagnostic Studies: Dg Chest 2 View  Result Date: 02/19/2016 CLINICAL DATA:  Preoperative evaluation for right knee surgery, history diabetes mellitus, hypertension, known alcoholic fatty liver disease, sleep apnea EXAM: CHEST  2 VIEW COMPARISON:  07/20/2013 FINDINGS: Upper normal heart size. Mediastinal contours and pulmonary vascularity normal. Lungs clear. No pleural effusion or pneumothorax. Question prior RIGHT rotator cuff repair. No acute osseous findings. IMPRESSION: No acute abnormalities. Electronically Signed   By: Ulyses Southward M.D.   On: 02/19/2016 18:24    Disposition:   Discharge Instructions    Call MD / Call 911    Complete by:  As directed    If you experience chest pain or shortness of breath, CALL 911 and be transported to the hospital emergency room.  If you develope a fever above 101 F, pus (white drainage) or increased drainage or redness at the wound, or calf pain, call your surgeon's office.   Constipation Prevention    Complete by:  As directed    Drink plenty of fluids.  Prune juice may be helpful.  You may use a stool softener, such as Colace (over the counter) 100 mg twice a day.  Use MiraLax (over the counter) for constipation as needed.   Diet - low sodium heart healthy    Complete by:   As directed    Driving restrictions    Complete by:  As directed    No driving for 2 weeks   Increase activity slowly as tolerated    Complete by:  As directed    Patient may shower    Complete by:  As directed    You may shower without a dressing once there is no drainage.  Do not wash over the wound.  If drainage remains, cover wound with plastic wrap and then shower.      Follow-up Information    Nestor Lewandowsky, MD Follow up in 2 week(s).   Specialty:  Orthopedic Surgery Contact information: [address] [place] [address] [phone]            Signed: Vear Clock Azzie Thiem R 02/28/2016, 1:36 PM

## 2016-02-28 NOTE — Progress Notes (Signed)
PATIENT ID: Allison LedgerJoyce C Mcdaniel  MRN: [id number]  DOB/AGE:  1955-12-03 / 61 y.o.  1 Day Post-Op Procedure(s) (LRB): RIGHT TOTAL KNEE ARTHROPLASTY (Right)    PROGRESS NOTE Subjective: Patient is alert, oriented, no Nausea, no Vomiting, yes passing gas. Taking PO well. Denies SOB, Chest or Calf Pain. Using Incentive Spirometer, PAS in place. Ambulate walking in room, Patient reports pain as 3/10 .    Objective: Vital signs in last 24 hours: Vitals:   02/27/16 1741 02/27/16 2100 02/28/16 0005 02/28/16 0400  BP: 139/84 (!) 145/70 (!) 151/75 (!) 152/70  Pulse: 76 87 93 81  Resp: 16 16 16 17   Temp: 97.5 F (36.4 C) 99.2 F (37.3 C) 99.4 F (37.4 C) 99.3 F (37.4 C)  TempSrc:  Oral Oral Oral  SpO2: 100% 98% 98% 97%  Weight:          Intake/Output from previous day: I/O last 3 completed shifts: In: 2425.4 [P.O.:240; I.V.:2185.4] Out: 500 [Urine:350; Blood:150]   Intake/Output this shift: No intake/output data recorded.   LABORATORY DATA:  Recent Labs  02/27/16 1801 02/27/16 2113 02/28/16 0324 02/28/16 0613  WBC  --   --  10.2  --   HGB  --   --  10.7*  --   HCT  --   --  32.6*  --   PLT  --   --  260  --   NA  --   --  134*  --   K  --   --  4.1  --   CL  --   --  100*  --   CO2  --   --  25  --   BUN  --   --  10  --   CREATININE  --   --  0.99  --   GLUCOSE  --   --  213*  --   GLUCAP 138* 220*  --  241*  CALCIUM  --   --  8.8*  --     Examination: Neurologically intact ABD soft Neurovascular intact Sensation intact distally Intact pulses distally Dorsiflexion/Plantar flexion intact Incision: dressing C/D/I No cellulitis present Compartment soft}  Assessment:   1 Day Post-Op Procedure(s) (LRB): RIGHT TOTAL KNEE ARTHROPLASTY (Right) ADDITIONAL DIAGNOSIS: Expected Acute Blood Loss Anemia, Diabetes, Hypertension and Sleep Apnea Obesity.  Plan: PT/OT WBAT, AROM and PROM  DVT Prophylaxis:  SCDx72hrs, ASA 325 mg BID x 2 weeks DISCHARGE PLAN: Home DISCHARGE  NEEDS: HHPT, Walker and 3-in-1 comode seat     Johnothan Bascomb J 02/28/2016, 8:11 AM Patient ID: Allison Mcdaniel, female   DOB: 1955-12-03, 61 y.o.   MRN: [id number]

## 2016-02-28 NOTE — Progress Notes (Signed)
Physical Therapy Treatment Patient Details Name: Allison Mcdaniel MRN: [id number] DOB: Jun 10, 1955 Today's Date: 02/28/2016    History of Present Illness 61 y.o. female admitted to [facility] on 02/27/16 for elective R TKA.  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    PT Comments    Pt is POD 1 and is moving well with therapy but is limited by pain this session. Pt requires 2 rest breaks this session between stair negotiation and gait. Pt is hopeful to return home despite increased pain this session. Pt will benefit from review of gait and stair negotiation prior to discharge.    Follow Up Recommendations  Home health PT;Supervision for mobility/OOB     Equipment Recommendations  Rolling walker with 5" wheels    Recommendations for Other Services       Precautions / Restrictions Precautions Precautions: Knee Precaution Booklet Issued: Yes (comment) Precaution Comments: knee exercise handout given and no pillow under surgical knee reviewed Restrictions Weight Bearing Restrictions: Yes RLE Weight Bearing: Weight bearing as tolerated    Mobility  Bed Mobility               General bed mobility comments: OOB in recliner when PT arrives  Transfers Overall transfer level: Needs assistance Equipment used: Rolling walker (2 wheeled) Transfers: Sit to/from Stand Sit to Stand: Min guard         General transfer comment: Min guard assist for safety during transitions.   Ambulation/Gait Ambulation/Gait assistance: Min guard;Supervision Ambulation Distance (Feet): 200 Feet Assistive device: Rolling walker (2 wheeled) Gait Pattern/deviations: Step-through pattern;Antalgic Gait velocity: decreased Gait velocity interpretation: Below normal speed for age/gender General Gait Details: Pt continues to have Moderate antalgic gait with decreased step length bilaterally.    Stairs Stairs: Yes   Stair Management: Two rails Number of Stairs:  2 General stair comments: Cues for sequencing  Wheelchair Mobility    Modified Rankin (Stroke Patients Only)       Balance Overall balance assessment: Needs assistance Sitting-balance support: Feet supported;No upper extremity supported Sitting balance-Leahy Scale: Good     Standing balance support: No upper extremity supported;Bilateral upper extremity supported;Single extremity supported Standing balance-Leahy Scale: Fair                      Cognition Arousal/Alertness: Awake/alert Behavior During Therapy: WFL for tasks assessed/performed Overall Cognitive Status: Within Functional Limits for tasks assessed                      Exercises Total Joint Exercises Ankle Circles/Pumps: AROM;Both;20 reps    General Comments        Pertinent Vitals/Pain Pain Assessment: 0-10 Pain Score: 7  Pain Location: right knee Pain Descriptors / Indicators: Aching;Burning Pain Intervention(s): Monitored during session;Premedicated before session;Repositioned;Ice applied    Home Living                      Prior Function            PT Goals (current goals can now be found in the care plan section) Acute Rehab PT Goals Patient Stated Goal: to go home as soon as she is allowed Progress towards PT goals: Progressing toward goals    Frequency    7X/week      PT Plan Current plan remains appropriate    Co-evaluation             End of  Session Equipment Utilized During Treatment: Gait belt Activity Tolerance: Patient limited by pain Patient left: in chair;with call bell/phone within reach;with family/visitor present     Time: 4098-1191 PT Time Calculation (min) (ACUTE ONLY): 41 min  Charges:  $Gait Training: 23-37 mins $Therapeutic Activity: 8-22 mins                    G Codes:      Colin Broach PT, DPT  [phone]  02/28/2016, 1:39 PM

## 2016-02-28 NOTE — Progress Notes (Signed)
Placed patient on CPAP for the night.  Patient is tolerating well at this time. 

## 2016-02-28 NOTE — Care Management Note (Signed)
Case Management Note  Patient Details  Name: Allison Mcdaniel MRN: [id number] Date of Birth: [birth year]  Subjective/Objective:   61 yr old female s/p right total knee arthroplasty.                 Action/Plan: Case manager spoke with patient concerning Home Health and DME needs. Choice was offered for Home Health Agency, patient requested [facility]. CM called referral to Ayesha RumpfMary Yonjof, [facility]. Patient will have family assistance at discharge.  Expected Discharge Date:  02/28/16               Expected Discharge Plan:  Home w Home Health Services  In-House Referral:  NA  Discharge planning Services  CM Consult  Post Acute Care Choice:  Durable Medical Equipment, Home Health Choice offered to:  Patient  DME Arranged:  Dan HumphreysWalker wide DME Agency:  [facility]  HH Arranged:  PT HH Agency:  [facility]  Status of Service:  Completed, signed off  If discussed at [employer] Length of Stay Meetings, dates discussed:    Additional Comments:  Durenda GuthrieBrady, Zavon Hyson Naomi, RN 02/28/2016, 3:45 PM

## 2016-02-28 NOTE — Progress Notes (Signed)
Pt discharge education and instructions completed with pt and family at bedside; all voices understanding and denies any questions. Pt IV removed; pt discharge home with family to transport her home. Right knee incision dsg unchanged with old drainage intact. Pt refused for RN to change dsg. Pt handed her prescriptions for Roxicet, Eliquis and Robaxin. Pt transported off unit via wheelchair with belongings and family to the side. Pt premedicated prior to discharge. Arabella MerlesP. Amo Rahima Fleishman RN.

## 2016-03-01 DIAGNOSIS — K635 Polyp of colon: Secondary | ICD-10-CM | POA: Diagnosis not present

## 2016-03-01 DIAGNOSIS — F329 Major depressive disorder, single episode, unspecified: Secondary | ICD-10-CM | POA: Diagnosis not present

## 2016-03-01 DIAGNOSIS — I10 Essential (primary) hypertension: Secondary | ICD-10-CM | POA: Diagnosis not present

## 2016-03-01 DIAGNOSIS — E119 Type 2 diabetes mellitus without complications: Secondary | ICD-10-CM | POA: Diagnosis not present

## 2016-03-01 DIAGNOSIS — M1991 Primary osteoarthritis, unspecified site: Secondary | ICD-10-CM | POA: Diagnosis not present

## 2016-03-01 DIAGNOSIS — Z471 Aftercare following joint replacement surgery: Secondary | ICD-10-CM | POA: Diagnosis not present

## 2016-03-01 DIAGNOSIS — E785 Hyperlipidemia, unspecified: Secondary | ICD-10-CM | POA: Diagnosis not present

## 2016-03-03 DIAGNOSIS — E785 Hyperlipidemia, unspecified: Secondary | ICD-10-CM | POA: Diagnosis not present

## 2016-03-03 DIAGNOSIS — F329 Major depressive disorder, single episode, unspecified: Secondary | ICD-10-CM | POA: Diagnosis not present

## 2016-03-03 DIAGNOSIS — M1991 Primary osteoarthritis, unspecified site: Secondary | ICD-10-CM | POA: Diagnosis not present

## 2016-03-03 DIAGNOSIS — K635 Polyp of colon: Secondary | ICD-10-CM | POA: Diagnosis not present

## 2016-03-03 DIAGNOSIS — Z471 Aftercare following joint replacement surgery: Secondary | ICD-10-CM | POA: Diagnosis not present

## 2016-03-03 DIAGNOSIS — E119 Type 2 diabetes mellitus without complications: Secondary | ICD-10-CM | POA: Diagnosis not present

## 2016-03-03 DIAGNOSIS — I10 Essential (primary) hypertension: Secondary | ICD-10-CM | POA: Diagnosis not present

## 2016-03-05 DIAGNOSIS — E785 Hyperlipidemia, unspecified: Secondary | ICD-10-CM | POA: Diagnosis not present

## 2016-03-05 DIAGNOSIS — E119 Type 2 diabetes mellitus without complications: Secondary | ICD-10-CM | POA: Diagnosis not present

## 2016-03-05 DIAGNOSIS — F329 Major depressive disorder, single episode, unspecified: Secondary | ICD-10-CM | POA: Diagnosis not present

## 2016-03-05 DIAGNOSIS — I10 Essential (primary) hypertension: Secondary | ICD-10-CM | POA: Diagnosis not present

## 2016-03-05 DIAGNOSIS — M1991 Primary osteoarthritis, unspecified site: Secondary | ICD-10-CM | POA: Diagnosis not present

## 2016-03-05 DIAGNOSIS — Z471 Aftercare following joint replacement surgery: Secondary | ICD-10-CM | POA: Diagnosis not present

## 2016-03-05 DIAGNOSIS — K635 Polyp of colon: Secondary | ICD-10-CM | POA: Diagnosis not present

## 2016-03-06 NOTE — Progress Notes (Signed)
   02/27/16 1821  PT G-Codes **NOT FOR INPATIENT CLASS**  Functional Assessment Tool Used assist level  Functional Limitation Mobility: Walking and moving around  Mobility: Walking and Moving Around Current Status [phone](G8978) CI  Mobility: Walking and Moving Around Goal Status [phone](G8979) [facility]  03/06/2016 late entry g-codes Kipling Graser B. Laurren Lepkowski, PT, DPT [phone]

## 2016-03-07 DIAGNOSIS — F329 Major depressive disorder, single episode, unspecified: Secondary | ICD-10-CM | POA: Diagnosis not present

## 2016-03-07 DIAGNOSIS — K635 Polyp of colon: Secondary | ICD-10-CM | POA: Diagnosis not present

## 2016-03-07 DIAGNOSIS — E119 Type 2 diabetes mellitus without complications: Secondary | ICD-10-CM | POA: Diagnosis not present

## 2016-03-07 DIAGNOSIS — I10 Essential (primary) hypertension: Secondary | ICD-10-CM | POA: Diagnosis not present

## 2016-03-07 DIAGNOSIS — Z471 Aftercare following joint replacement surgery: Secondary | ICD-10-CM | POA: Diagnosis not present

## 2016-03-07 DIAGNOSIS — E785 Hyperlipidemia, unspecified: Secondary | ICD-10-CM | POA: Diagnosis not present

## 2016-03-07 DIAGNOSIS — M1991 Primary osteoarthritis, unspecified site: Secondary | ICD-10-CM | POA: Diagnosis not present

## 2016-03-10 DIAGNOSIS — E785 Hyperlipidemia, unspecified: Secondary | ICD-10-CM | POA: Diagnosis not present

## 2016-03-10 DIAGNOSIS — I10 Essential (primary) hypertension: Secondary | ICD-10-CM | POA: Diagnosis not present

## 2016-03-10 DIAGNOSIS — E119 Type 2 diabetes mellitus without complications: Secondary | ICD-10-CM | POA: Diagnosis not present

## 2016-03-10 DIAGNOSIS — M1991 Primary osteoarthritis, unspecified site: Secondary | ICD-10-CM | POA: Diagnosis not present

## 2016-03-10 DIAGNOSIS — F329 Major depressive disorder, single episode, unspecified: Secondary | ICD-10-CM | POA: Diagnosis not present

## 2016-03-10 DIAGNOSIS — K635 Polyp of colon: Secondary | ICD-10-CM | POA: Diagnosis not present

## 2016-03-10 DIAGNOSIS — Z471 Aftercare following joint replacement surgery: Secondary | ICD-10-CM | POA: Diagnosis not present

## 2016-03-11 DIAGNOSIS — Z96651 Presence of right artificial knee joint: Secondary | ICD-10-CM | POA: Diagnosis not present

## 2016-03-11 DIAGNOSIS — M1711 Unilateral primary osteoarthritis, right knee: Secondary | ICD-10-CM | POA: Diagnosis not present

## 2016-03-12 DIAGNOSIS — Z471 Aftercare following joint replacement surgery: Secondary | ICD-10-CM | POA: Diagnosis not present

## 2016-03-12 DIAGNOSIS — K635 Polyp of colon: Secondary | ICD-10-CM | POA: Diagnosis not present

## 2016-03-12 DIAGNOSIS — F329 Major depressive disorder, single episode, unspecified: Secondary | ICD-10-CM | POA: Diagnosis not present

## 2016-03-12 DIAGNOSIS — E785 Hyperlipidemia, unspecified: Secondary | ICD-10-CM | POA: Diagnosis not present

## 2016-03-12 DIAGNOSIS — E119 Type 2 diabetes mellitus without complications: Secondary | ICD-10-CM | POA: Diagnosis not present

## 2016-03-12 DIAGNOSIS — M1991 Primary osteoarthritis, unspecified site: Secondary | ICD-10-CM | POA: Diagnosis not present

## 2016-03-12 DIAGNOSIS — I10 Essential (primary) hypertension: Secondary | ICD-10-CM | POA: Diagnosis not present

## 2016-03-14 DIAGNOSIS — E119 Type 2 diabetes mellitus without complications: Secondary | ICD-10-CM | POA: Diagnosis not present

## 2016-03-14 DIAGNOSIS — I10 Essential (primary) hypertension: Secondary | ICD-10-CM | POA: Diagnosis not present

## 2016-03-14 DIAGNOSIS — F329 Major depressive disorder, single episode, unspecified: Secondary | ICD-10-CM | POA: Diagnosis not present

## 2016-03-14 DIAGNOSIS — M1991 Primary osteoarthritis, unspecified site: Secondary | ICD-10-CM | POA: Diagnosis not present

## 2016-03-14 DIAGNOSIS — Z471 Aftercare following joint replacement surgery: Secondary | ICD-10-CM | POA: Diagnosis not present

## 2016-03-14 DIAGNOSIS — E785 Hyperlipidemia, unspecified: Secondary | ICD-10-CM | POA: Diagnosis not present

## 2016-03-14 DIAGNOSIS — K635 Polyp of colon: Secondary | ICD-10-CM | POA: Diagnosis not present

## 2016-03-17 DIAGNOSIS — Z471 Aftercare following joint replacement surgery: Secondary | ICD-10-CM | POA: Diagnosis not present

## 2016-03-17 DIAGNOSIS — M25561 Pain in right knee: Secondary | ICD-10-CM | POA: Diagnosis not present

## 2016-03-17 DIAGNOSIS — Z96651 Presence of right artificial knee joint: Secondary | ICD-10-CM | POA: Diagnosis not present

## 2016-03-19 DIAGNOSIS — Z96651 Presence of right artificial knee joint: Secondary | ICD-10-CM | POA: Diagnosis not present

## 2016-03-19 DIAGNOSIS — M25561 Pain in right knee: Secondary | ICD-10-CM | POA: Diagnosis not present

## 2016-03-19 DIAGNOSIS — Z471 Aftercare following joint replacement surgery: Secondary | ICD-10-CM | POA: Diagnosis not present

## 2016-03-21 DIAGNOSIS — M25561 Pain in right knee: Secondary | ICD-10-CM | POA: Diagnosis not present

## 2016-03-21 DIAGNOSIS — Z471 Aftercare following joint replacement surgery: Secondary | ICD-10-CM | POA: Diagnosis not present

## 2016-03-21 DIAGNOSIS — Z96651 Presence of right artificial knee joint: Secondary | ICD-10-CM | POA: Diagnosis not present

## 2016-03-24 DIAGNOSIS — Z96651 Presence of right artificial knee joint: Secondary | ICD-10-CM | POA: Diagnosis not present

## 2016-03-24 DIAGNOSIS — Z471 Aftercare following joint replacement surgery: Secondary | ICD-10-CM | POA: Diagnosis not present

## 2016-03-24 DIAGNOSIS — M25561 Pain in right knee: Secondary | ICD-10-CM | POA: Diagnosis not present

## 2016-03-26 DIAGNOSIS — M25561 Pain in right knee: Secondary | ICD-10-CM | POA: Diagnosis not present

## 2016-03-26 DIAGNOSIS — Z471 Aftercare following joint replacement surgery: Secondary | ICD-10-CM | POA: Diagnosis not present

## 2016-03-26 DIAGNOSIS — Z96651 Presence of right artificial knee joint: Secondary | ICD-10-CM | POA: Diagnosis not present

## 2016-03-27 DIAGNOSIS — Z96651 Presence of right artificial knee joint: Secondary | ICD-10-CM | POA: Diagnosis not present

## 2016-03-27 DIAGNOSIS — M25561 Pain in right knee: Secondary | ICD-10-CM | POA: Diagnosis not present

## 2016-03-27 DIAGNOSIS — Z471 Aftercare following joint replacement surgery: Secondary | ICD-10-CM | POA: Diagnosis not present

## 2016-04-01 DIAGNOSIS — Z471 Aftercare following joint replacement surgery: Secondary | ICD-10-CM | POA: Diagnosis not present

## 2016-04-01 DIAGNOSIS — M25561 Pain in right knee: Secondary | ICD-10-CM | POA: Diagnosis not present

## 2016-04-01 DIAGNOSIS — Z96651 Presence of right artificial knee joint: Secondary | ICD-10-CM | POA: Diagnosis not present

## 2016-04-03 DIAGNOSIS — Z96651 Presence of right artificial knee joint: Secondary | ICD-10-CM | POA: Diagnosis not present

## 2016-04-03 DIAGNOSIS — M25561 Pain in right knee: Secondary | ICD-10-CM | POA: Diagnosis not present

## 2016-04-03 DIAGNOSIS — Z471 Aftercare following joint replacement surgery: Secondary | ICD-10-CM | POA: Diagnosis not present

## 2016-04-08 DIAGNOSIS — Z471 Aftercare following joint replacement surgery: Secondary | ICD-10-CM | POA: Diagnosis not present

## 2016-04-08 DIAGNOSIS — Z96651 Presence of right artificial knee joint: Secondary | ICD-10-CM | POA: Diagnosis not present

## 2016-04-08 DIAGNOSIS — M25561 Pain in right knee: Secondary | ICD-10-CM | POA: Diagnosis not present

## 2016-04-10 DIAGNOSIS — Z96651 Presence of right artificial knee joint: Secondary | ICD-10-CM | POA: Diagnosis not present

## 2016-04-10 DIAGNOSIS — Z471 Aftercare following joint replacement surgery: Secondary | ICD-10-CM | POA: Diagnosis not present

## 2016-04-10 DIAGNOSIS — M25561 Pain in right knee: Secondary | ICD-10-CM | POA: Diagnosis not present

## 2016-04-16 DIAGNOSIS — Z96651 Presence of right artificial knee joint: Secondary | ICD-10-CM | POA: Diagnosis not present

## 2016-04-16 DIAGNOSIS — M25561 Pain in right knee: Secondary | ICD-10-CM | POA: Diagnosis not present

## 2016-04-16 DIAGNOSIS — Z471 Aftercare following joint replacement surgery: Secondary | ICD-10-CM | POA: Diagnosis not present

## 2016-05-28 DIAGNOSIS — Z7984 Long term (current) use of oral hypoglycemic drugs: Secondary | ICD-10-CM | POA: Diagnosis not present

## 2016-05-28 DIAGNOSIS — E119 Type 2 diabetes mellitus without complications: Secondary | ICD-10-CM | POA: Diagnosis not present

## 2016-05-28 DIAGNOSIS — E7800 Pure hypercholesterolemia, unspecified: Secondary | ICD-10-CM | POA: Diagnosis not present

## 2016-06-12 DIAGNOSIS — Z09 Encounter for follow-up examination after completed treatment for conditions other than malignant neoplasm: Secondary | ICD-10-CM | POA: Diagnosis not present

## 2016-06-12 DIAGNOSIS — Z96651 Presence of right artificial knee joint: Secondary | ICD-10-CM | POA: Diagnosis not present

## 2016-06-12 DIAGNOSIS — M25561 Pain in right knee: Secondary | ICD-10-CM | POA: Diagnosis not present

## 2016-06-30 NOTE — Addendum Note (Signed)
Addendum  created 06/30/16 1039 by Oris Calmes, MD   Sign clinical note    

## 2016-07-18 DIAGNOSIS — H40021 Open angle with borderline findings, high risk, right eye: Secondary | ICD-10-CM | POA: Diagnosis not present

## 2016-08-06 DIAGNOSIS — G4733 Obstructive sleep apnea (adult) (pediatric): Secondary | ICD-10-CM | POA: Diagnosis not present

## 2016-08-07 DIAGNOSIS — S70261A Insect bite (nonvenomous), right hip, initial encounter: Secondary | ICD-10-CM | POA: Diagnosis not present

## 2016-08-07 DIAGNOSIS — E1165 Type 2 diabetes mellitus with hyperglycemia: Secondary | ICD-10-CM | POA: Diagnosis not present

## 2016-08-07 DIAGNOSIS — N399 Disorder of urinary system, unspecified: Secondary | ICD-10-CM | POA: Diagnosis not present

## 2016-08-14 DIAGNOSIS — M25561 Pain in right knee: Secondary | ICD-10-CM | POA: Diagnosis not present

## 2016-09-04 DIAGNOSIS — M25561 Pain in right knee: Secondary | ICD-10-CM | POA: Diagnosis not present

## 2016-09-04 DIAGNOSIS — M25461 Effusion, right knee: Secondary | ICD-10-CM | POA: Diagnosis not present

## 2016-09-23 DIAGNOSIS — Z1211 Encounter for screening for malignant neoplasm of colon: Secondary | ICD-10-CM | POA: Diagnosis not present

## 2016-10-01 DIAGNOSIS — Z01419 Encounter for gynecological examination (general) (routine) without abnormal findings: Secondary | ICD-10-CM | POA: Diagnosis not present

## 2016-10-01 DIAGNOSIS — Z1231 Encounter for screening mammogram for malignant neoplasm of breast: Secondary | ICD-10-CM | POA: Diagnosis not present

## 2016-10-03 DIAGNOSIS — M7631 Iliotibial band syndrome, right leg: Secondary | ICD-10-CM | POA: Diagnosis not present

## 2016-10-03 DIAGNOSIS — M24661 Ankylosis, right knee: Secondary | ICD-10-CM | POA: Diagnosis not present

## 2016-10-03 DIAGNOSIS — Z96651 Presence of right artificial knee joint: Secondary | ICD-10-CM | POA: Diagnosis not present

## 2016-10-10 DIAGNOSIS — Z9889 Other specified postprocedural states: Secondary | ICD-10-CM | POA: Diagnosis not present

## 2016-10-17 DIAGNOSIS — J018 Other acute sinusitis: Secondary | ICD-10-CM | POA: Diagnosis not present

## 2016-10-22 DIAGNOSIS — M62830 Muscle spasm of back: Secondary | ICD-10-CM | POA: Diagnosis not present

## 2016-11-13 DIAGNOSIS — Z9889 Other specified postprocedural states: Secondary | ICD-10-CM | POA: Diagnosis not present

## 2016-11-14 DIAGNOSIS — E1165 Type 2 diabetes mellitus with hyperglycemia: Secondary | ICD-10-CM | POA: Diagnosis not present

## 2016-12-11 DIAGNOSIS — I788 Other diseases of capillaries: Secondary | ICD-10-CM | POA: Diagnosis not present

## 2016-12-11 DIAGNOSIS — L817 Pigmented purpuric dermatosis: Secondary | ICD-10-CM | POA: Diagnosis not present

## 2016-12-11 DIAGNOSIS — D2271 Melanocytic nevi of right lower limb, including hip: Secondary | ICD-10-CM | POA: Diagnosis not present

## 2016-12-11 DIAGNOSIS — B078 Other viral warts: Secondary | ICD-10-CM | POA: Diagnosis not present

## 2016-12-11 DIAGNOSIS — L570 Actinic keratosis: Secondary | ICD-10-CM | POA: Diagnosis not present

## 2016-12-11 DIAGNOSIS — D1801 Hemangioma of skin and subcutaneous tissue: Secondary | ICD-10-CM | POA: Diagnosis not present

## 2017-03-03 DIAGNOSIS — E559 Vitamin D deficiency, unspecified: Secondary | ICD-10-CM | POA: Diagnosis not present

## 2017-03-03 DIAGNOSIS — E1165 Type 2 diabetes mellitus with hyperglycemia: Secondary | ICD-10-CM | POA: Diagnosis not present

## 2017-03-03 DIAGNOSIS — Z23 Encounter for immunization: Secondary | ICD-10-CM | POA: Diagnosis not present

## 2017-03-03 DIAGNOSIS — Z0000 Encounter for general adult medical examination without abnormal findings: Secondary | ICD-10-CM | POA: Diagnosis not present

## 2017-03-03 DIAGNOSIS — E7800 Pure hypercholesterolemia, unspecified: Secondary | ICD-10-CM | POA: Diagnosis not present

## 2017-03-03 DIAGNOSIS — Z79899 Other long term (current) drug therapy: Secondary | ICD-10-CM | POA: Diagnosis not present

## 2017-04-02 ENCOUNTER — Other Ambulatory Visit: Payer: Self-pay | Admitting: Family Medicine

## 2017-04-02 ENCOUNTER — Other Ambulatory Visit: Payer: BLUE CROSS/BLUE SHIELD

## 2017-04-02 DIAGNOSIS — M7989 Other specified soft tissue disorders: Secondary | ICD-10-CM | POA: Diagnosis not present

## 2017-04-02 DIAGNOSIS — M79604 Pain in right leg: Secondary | ICD-10-CM

## 2017-04-04 ENCOUNTER — Encounter (HOSPITAL_COMMUNITY): Payer: Self-pay | Admitting: Emergency Medicine

## 2017-04-04 ENCOUNTER — Emergency Department (HOSPITAL_COMMUNITY)
Admission: EM | Admit: 2017-04-04 | Discharge: 2017-04-04 | Disposition: A | Discharge status: Home / Self Care | Payer: BLUE CROSS/BLUE SHIELD | Attending: Emergency Medicine | Admitting: Emergency Medicine

## 2017-04-04 ENCOUNTER — Emergency Department (HOSPITAL_BASED_OUTPATIENT_CLINIC_OR_DEPARTMENT_OTHER)
Admission: RE | Admit: 2017-04-04 | Discharge: 2017-04-04 | Disposition: A | Discharge status: Home / Self Care | Payer: BLUE CROSS/BLUE SHIELD | Source: Ambulatory Visit | Attending: Emergency Medicine | Admitting: Emergency Medicine

## 2017-04-04 DIAGNOSIS — Z794 Long term (current) use of insulin: Secondary | ICD-10-CM | POA: Diagnosis not present

## 2017-04-04 DIAGNOSIS — M7989 Other specified soft tissue disorders: Secondary | ICD-10-CM

## 2017-04-04 DIAGNOSIS — M79661 Pain in right lower leg: Secondary | ICD-10-CM

## 2017-04-04 DIAGNOSIS — E119 Type 2 diabetes mellitus without complications: Secondary | ICD-10-CM | POA: Diagnosis not present

## 2017-04-04 DIAGNOSIS — Z7982 Long term (current) use of aspirin: Secondary | ICD-10-CM | POA: Diagnosis not present

## 2017-04-04 DIAGNOSIS — Z79899 Other long term (current) drug therapy: Secondary | ICD-10-CM | POA: Insufficient documentation

## 2017-04-04 DIAGNOSIS — M79609 Pain in unspecified limb: Secondary | ICD-10-CM | POA: Diagnosis not present

## 2017-04-04 DIAGNOSIS — I10 Essential (primary) hypertension: Secondary | ICD-10-CM | POA: Insufficient documentation

## 2017-04-04 DIAGNOSIS — Z96651 Presence of right artificial knee joint: Secondary | ICD-10-CM | POA: Insufficient documentation

## 2017-04-04 DIAGNOSIS — M660 Rupture of popliteal cyst: Secondary | ICD-10-CM

## 2017-04-04 DIAGNOSIS — R600 Localized edema: Secondary | ICD-10-CM | POA: Diagnosis not present

## 2017-04-04 LAB — BASIC METABOLIC PANEL
Anion gap: 9 (ref 5–15)
BUN: 14 mg/dL (ref 6–20)
CHLORIDE: 102 mmol/L (ref 101–111)
CO2: 26 mmol/L (ref 22–32)
CREATININE: 0.96 mg/dL (ref 0.44–1.00)
Calcium: 9.2 mg/dL (ref 8.9–10.3)
GFR calc Af Amer: >60 mL/min (ref >60)
GLUCOSE: 220 mg/dL — AB (ref 65–99)
Potassium: 3.7 mmol/L (ref 3.5–5.1)
SODIUM: 137 mmol/L (ref 135–145)

## 2017-04-04 LAB — CBC WITH DIFFERENTIAL/PLATELET
Basophils Absolute: 0 10*3/uL (ref 0.0–0.1)
Basophils Relative: 0 %
EOS ABS: 0.2 10*3/uL (ref 0.0–0.7)
EOS PCT: 3 %
HCT: 34.5 % — ABNORMAL LOW (ref 36.0–46.0)
Hemoglobin: 11.3 g/dL — ABNORMAL LOW (ref 12.0–15.0)
LYMPHS ABS: 2.3 10*3/uL (ref 0.7–4.0)
LYMPHS PCT: 36 %
MCH: 30 pg (ref 26.0–34.0)
MCHC: 32.8 g/dL (ref 30.0–36.0)
MCV: 91.5 fL (ref 78.0–100.0)
MONO ABS: 0.5 10*3/uL (ref 0.1–1.0)
MONOS PCT: 8 %
Neutro Abs: 3.5 10*3/uL (ref 1.7–7.7)
Neutrophils Relative %: 53 %
PLATELETS: 257 10*3/uL (ref 150–400)
RBC: 3.77 MIL/uL — ABNORMAL LOW (ref 3.87–5.11)
RDW: 14 % (ref 11.5–15.5)
WBC: 6.5 10*3/uL (ref 4.0–10.5)

## 2017-04-04 LAB — URINALYSIS, ROUTINE W REFLEX MICROSCOPIC
Bacteria, UA: NONE SEEN
Bilirubin Urine: NEGATIVE
Hgb urine dipstick: NEGATIVE
KETONES UR: NEGATIVE mg/dL
Leukocytes, UA: NEGATIVE
Nitrite: NEGATIVE
PROTEIN: NEGATIVE mg/dL
RBC / HPF: NONE SEEN RBC/hpf (ref 0–5)
SQUAMOUS EPITHELIAL / LPF: NONE SEEN
Specific Gravity, Urine: 1.006 (ref 1.005–1.030)
pH: 7 (ref 5.0–8.0)

## 2017-04-04 MED ORDER — OXYCODONE-ACETAMINOPHEN 5-325 MG PO TABS
1.0000 | ORAL_TABLET | Freq: Once | ORAL | Status: DC
  Filled 2017-04-04: qty 1

## 2017-04-04 MED ORDER — OXYCODONE HCL 5 MG PO TABS: 5.0000 mg | ORAL_TABLET | ORAL | 0 refills | Status: AC | PRN

## 2017-04-04 NOTE — ED Notes (Signed)
Vascular tech aware of patient 

## 2017-04-04 NOTE — ED Notes (Signed)
ED Provider at bedside. 

## 2017-04-04 NOTE — Progress Notes (Addendum)
VASCULAR LAB PRELIMINARY  PRELIMINARY  PRELIMINARY  PRELIMINARY  Right lower extremity venous duplex completed.    Preliminary report:  There is no DVT or SVT noted in the right lower extremity.  There is a partially ruptured Baker's cyst noted in the right popliteal fossa with fluid extending into the mid calf, there is also significant interstitial fluid noted throughout the calf.  Enlarged inguinal lymph nodes noted.   [facility]  Gerri Acre, RVT 04/04/2017, 1:31 PM

## 2017-04-04 NOTE — Discharge Instructions (Signed)
Initial management of ruptured Baker cyst include rest, elevation, anti-inflammatories. Occasionally, is initial management does not help symptoms, orthopedists do interarticular injections of glucocorticoids and/or surgery. Take 1000 mg of Tylenol every 6 hours plus your home meloxicam dose. For more severe, break through pain you can take oxycodone as precribed every 4 hours for severe pain. Elevate your extremity as much as possible.  Call orthopedist for re-evaluation within 1 week  Some complications of Baker cyst include compartment syndrome, compression of nerves and local arteries. Return for numbness, tingling, weakness or heaviness to your extremity, foot drop, worsening calf pain, tightness or hardness of the muscle, cold pale or purple extremities
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
## 2017-04-04 NOTE — ED Provider Notes (Signed)
As of right posterior knee pain for the past 2 weeks which is worse with walking.  Improved with remaining still.  On exam no distress right lower extremity not red or warm.  Minimally tender at posterior knee.  DP and PT pulses 2+ bilaterally. good Capillary refill   Doug SouJacubowitz, Trayvon Trumbull, MD 04/04/17 1409

## 2017-04-04 NOTE — ED Triage Notes (Signed)
Patint presents to ED for assessment of right leg pain x 2 weeks, with a hx of DVT.  Warm touch, pitting edema.  Patient had a doppler on Thursday with a negative result, [facility] who sent her here for further evaluation.  Patient is currently taking 324 ASA, 2 times per day.

## 2017-04-09 DIAGNOSIS — M25561 Pain in right knee: Secondary | ICD-10-CM | POA: Diagnosis not present

## 2017-04-15 ENCOUNTER — Encounter: Payer: Self-pay | Admitting: Endocrinology

## 2017-04-15 ENCOUNTER — Ambulatory Visit: Payer: BLUE CROSS/BLUE SHIELD | Admitting: Endocrinology

## 2017-04-15 VITALS — BP 132/84 | HR 77 | Ht 68.0 in | Wt 256.0 lb

## 2017-04-15 DIAGNOSIS — E1142 Type 2 diabetes mellitus with diabetic polyneuropathy: Secondary | ICD-10-CM | POA: Diagnosis not present

## 2017-04-15 MED ORDER — SEMAGLUTIDE(0.25 OR 0.5MG/DOS) 2 MG/1.5ML ~~LOC~~ SOPN: 0.2500 mg | PEN_INJECTOR | SUBCUTANEOUS | 11 refills | Status: DC
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
## 2017-04-15 NOTE — Patient Instructions (Addendum)
good diet and exercise significantly improve the control of your diabetes.  please let me know if you wish to be referred to a dietician.  high blood sugar is very risky to your health.  you should see an eye doctor and dentist every year.  It is very important to get all recommended vaccinations.  Controlling your blood pressure and cholesterol drastically reduces the damage diabetes does to your body.  Those who smoke should quit.  Please discuss these with your doctor.  check your blood sugar twice a day.  vary the time of day when you check, between before the 3 meals, and at bedtime.  also check if you have symptoms of your blood sugar being too high or too low.  please keep a record of the readings and bring it to your next appointment here (or you can bring the meter itself).  You can write it on any piece of paper.  please call us sooner if your blood sugar goes below 70, or if you have a lot of readings over 200. I have sent a prescription to your pharmacy, to add "Ozempic." Please continue the same other diabetes medications for now. Please call or message us in 2 weeks, to tell us how the blood sugar is doing Our plan will be to increase the Ozempic and add "jardiance."  Please come back for a follow-up appointment in 2 months.

## 2017-04-22 ENCOUNTER — Telehealth: Payer: Self-pay | Admitting: Endocrinology

## 2017-04-22 NOTE — Telephone Encounter (Signed)
Patient stated she is upping her Ozempic to 1/2mg   She is not having any nausea.

## 2017-04-23 NOTE — Telephone Encounter (Signed)
Ok, please let us know if this amount does not cause nausea, so we can increase it again.

## 2017-04-23 NOTE — Telephone Encounter (Signed)
LVM stating that if that ozempic dose did not cause nausea to let us know because he may increase again.

## 2017-04-27 ENCOUNTER — Telehealth: Payer: Self-pay | Admitting: Endocrinology

## 2017-04-27 ENCOUNTER — Other Ambulatory Visit: Payer: Self-pay

## 2017-04-27 NOTE — Telephone Encounter (Signed)
Ok to refill this at 30 units?

## 2017-04-27 NOTE — Telephone Encounter (Signed)
Insulin Lispro Prot & Lispro (HUMALOG MIX 75/25 KWIKPEN [place])   Patient needs a new prescription sent over to the pharmacy for this.   Patient states that Dr Laurine BlazerWalters changed prescription months ago to 45 in the morning to 30 in the evening.   Please advise.      [id number] - [place], [place] - [address]

## 2017-04-27 NOTE — Telephone Encounter (Signed)
OK 

## 2017-04-28 ENCOUNTER — Telehealth: Payer: Self-pay | Admitting: Endocrinology

## 2017-04-28 ENCOUNTER — Other Ambulatory Visit: Payer: Self-pay

## 2017-04-28 MED ORDER — INSULIN LISPRO PROT & LISPRO (75-25 MIX) 100 UNIT/ML KWIKPEN: PEN_INJECTOR | SUBCUTANEOUS | 11 refills | Status: DC

## 2017-04-28 NOTE — Telephone Encounter (Signed)
Patient stated Humalog was increased but not called in properly , caller not sure she has enough to to last until Monday.  Please advise  Called [facility]

## 2017-04-28 NOTE — Telephone Encounter (Signed)
I have sent new prescription to patient's pharmacy.

## 2017-04-28 NOTE — Telephone Encounter (Signed)
I called and left patient VM on home as well as cell. I notified her that prescription was sent in humalog 75/25 30 units 2x daily. I asked that she call back if anything is wrong with this script.

## 2017-05-01 DIAGNOSIS — Z23 Encounter for immunization: Secondary | ICD-10-CM | POA: Diagnosis not present

## 2017-05-11 ENCOUNTER — Telehealth: Payer: Self-pay | Admitting: Emergency Medicine

## 2017-05-11 NOTE — Telephone Encounter (Signed)
Pt called and wants to know if you want her to continue increasing her Semaglutide (OZEMPIC) 0.25 or 0.5 MG/DOSE SOPN. Please advise and give her a call back thanks.

## 2017-05-12 NOTE — Telephone Encounter (Signed)
We can increase, but I need to know first: are you taking the 0.25 or 0.5?  How are cbg's doing?

## 2017-05-12 NOTE — Telephone Encounter (Signed)
LVM for patient to call back to verify ozempic dosage & tell us how cbg's are running.

## 2017-05-13 ENCOUNTER — Telehealth: Payer: Self-pay | Admitting: Endocrinology

## 2017-05-13 NOTE — Telephone Encounter (Signed)
Patient is returning your call,she stated her b/s were between 110 and 180

## 2017-05-14 MED ORDER — INSULIN LISPRO PROT & LISPRO (75-25 MIX) 100 UNIT/ML KWIKPEN: 20.0000 [IU] | PEN_INJECTOR | Freq: Two times a day (BID) | SUBCUTANEOUS | 11 refills | Status: DC

## 2017-05-14 MED ORDER — SEMAGLUTIDE (1 MG/DOSE) 2 MG/1.5ML ~~LOC~~ SOPN: 1.0000 mg | PEN_INJECTOR | SUBCUTANEOUS | 3 refills | Status: DC

## 2017-05-14 NOTE — Telephone Encounter (Signed)
LVM with new dosage for patient & informed that new ozempic prescription was sent. I LVM since it was a long weekend, but ask that patient call back to verify she received message.

## 2017-05-14 NOTE — Telephone Encounter (Signed)
This is in regards to increasing ozempic. Please advise?

## 2017-05-14 NOTE — Telephone Encounter (Signed)
Please increase the ozempic to 1 mg weekly. I have sent a prescription to your pharmacy Also, please reduce the insulin to 20 units bid. Please call or message us next week, to tell us how the blood sugar is doing.

## 2017-06-15 ENCOUNTER — Encounter: Payer: Self-pay | Admitting: Endocrinology

## 2017-06-15 ENCOUNTER — Ambulatory Visit: Payer: BLUE CROSS/BLUE SHIELD | Admitting: Endocrinology

## 2017-06-15 VITALS — BP 164/91 | HR 74 | Wt 252.8 lb

## 2017-06-15 DIAGNOSIS — E1142 Type 2 diabetes mellitus with diabetic polyneuropathy: Secondary | ICD-10-CM

## 2017-06-15 LAB — POCT GLYCOSYLATED HEMOGLOBIN (HGB A1C): Hemoglobin A1C: 7.2 % — AB (ref 4.0–5.6)

## 2017-06-15 MED ORDER — DAPAGLIFLOZIN PROPANEDIOL 5 MG PO TABS: 5.0000 mg | ORAL_TABLET | Freq: Every day | ORAL | 11 refills | Status: DC

## 2017-06-15 NOTE — Patient Instructions (Addendum)
Your blood pressure is high today.  Please see your primary care provider soon, to have it rechecked check your blood sugar twice a day.  vary the time of day when you check, between before the 3 meals, and at bedtime.  also check if you have symptoms of your blood sugar being too high or too low.  please keep a record of the readings and bring it to your next appointment here (or you can bring the meter itself).  You can write it on any piece of paper.  please call us sooner if your blood sugar goes below 70, or if you have a lot of readings over 200. I have sent a prescription to your pharmacy, to add "farxiga." Please continue the same metformin and ozempic, and:  Reduce the insulin to 15 units twice a day during the week, and 20 units on weekends.   Please come back for a follow-up appointment in 2 months.
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
## 2017-07-20 DIAGNOSIS — N6310 Unspecified lump in the right breast, unspecified quadrant: Secondary | ICD-10-CM | POA: Diagnosis not present

## 2017-07-28 DIAGNOSIS — N6312 Unspecified lump in the right breast, upper inner quadrant: Secondary | ICD-10-CM | POA: Diagnosis not present

## 2017-07-28 DIAGNOSIS — R928 Other abnormal and inconclusive findings on diagnostic imaging of breast: Secondary | ICD-10-CM | POA: Diagnosis not present

## 2017-08-05 DIAGNOSIS — N6312 Unspecified lump in the right breast, upper inner quadrant: Secondary | ICD-10-CM | POA: Diagnosis not present

## 2017-08-25 ENCOUNTER — Encounter: Payer: Self-pay | Admitting: Endocrinology

## 2017-08-25 ENCOUNTER — Ambulatory Visit: Payer: BLUE CROSS/BLUE SHIELD | Admitting: Endocrinology

## 2017-08-25 VITALS — BP 164/100 | HR 67 | Temp 97.7°F | Ht 68.0 in | Wt 251.0 lb

## 2017-08-25 DIAGNOSIS — E1142 Type 2 diabetes mellitus with diabetic polyneuropathy: Secondary | ICD-10-CM

## 2017-08-25 LAB — POCT GLYCOSYLATED HEMOGLOBIN (HGB A1C): Hemoglobin A1C: 7.1 % — AB (ref 4.0–5.6)

## 2017-08-25 MED ORDER — INSULIN LISPRO PROT & LISPRO (75-25 MIX) 100 UNIT/ML KWIKPEN: 15.0000 [IU] | PEN_INJECTOR | Freq: Two times a day (BID) | SUBCUTANEOUS | 11 refills | Status: DC

## 2017-08-25 MED ORDER — CANAGLIFLOZIN 100 MG PO TABS: 100.0000 mg | ORAL_TABLET | Freq: Every day | ORAL | 3 refills | Status: DC

## 2017-08-25 NOTE — Patient Instructions (Addendum)
Your blood pressure is high today.  Please see your primary care provider soon, to have it rechecked check your blood sugar twice a day.  vary the time of day when you check, between before the 3 meals, and at bedtime.  also check if you have symptoms of your blood sugar being too high or too low.  please keep a record of the readings and bring it to your next appointment here (or you can bring the meter itself).  You can write it on any piece of paper.  please call us sooner if your blood sugar goes below 70, or if you have a lot of readings over 200. I have sent a prescription to your pharmacy, to add "invokana."  Please continue the same metformin and ozempic, and:  Reduce the insulin to 10 units twice a day on workdays, and 15 units on days off.   Keep the blister on your toe covered with antibiotic ointment and a bandaid, until it heals.   Please come back for a follow-up appointment in 2 months.
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
## 2017-09-14 DIAGNOSIS — H25043 Posterior subcapsular polar age-related cataract, bilateral: Secondary | ICD-10-CM | POA: Diagnosis not present

## 2017-09-14 DIAGNOSIS — E119 Type 2 diabetes mellitus without complications: Secondary | ICD-10-CM | POA: Diagnosis not present

## 2017-09-14 DIAGNOSIS — H25013 Cortical age-related cataract, bilateral: Secondary | ICD-10-CM | POA: Diagnosis not present

## 2017-09-14 DIAGNOSIS — H43813 Vitreous degeneration, bilateral: Secondary | ICD-10-CM | POA: Diagnosis not present

## 2017-09-14 LAB — HM DIABETES EYE EXAM

## 2017-09-23 DIAGNOSIS — Z09 Encounter for follow-up examination after completed treatment for conditions other than malignant neoplasm: Secondary | ICD-10-CM | POA: Diagnosis not present

## 2017-10-05 DIAGNOSIS — E119 Type 2 diabetes mellitus without complications: Secondary | ICD-10-CM | POA: Diagnosis not present

## 2017-10-05 DIAGNOSIS — Z6836 Body mass index (BMI) 36.0-36.9, adult: Secondary | ICD-10-CM | POA: Diagnosis not present

## 2017-10-05 DIAGNOSIS — Z01419 Encounter for gynecological examination (general) (routine) without abnormal findings: Secondary | ICD-10-CM | POA: Diagnosis not present

## 2017-10-19 DIAGNOSIS — R5382 Chronic fatigue, unspecified: Secondary | ICD-10-CM | POA: Diagnosis not present

## 2017-10-19 DIAGNOSIS — E1140 Type 2 diabetes mellitus with diabetic neuropathy, unspecified: Secondary | ICD-10-CM | POA: Diagnosis not present

## 2017-10-19 DIAGNOSIS — Z79899 Other long term (current) drug therapy: Secondary | ICD-10-CM | POA: Diagnosis not present

## 2017-10-19 DIAGNOSIS — M2550 Pain in unspecified joint: Secondary | ICD-10-CM | POA: Diagnosis not present

## 2017-10-30 ENCOUNTER — Ambulatory Visit: Payer: BLUE CROSS/BLUE SHIELD | Admitting: Endocrinology

## 2017-11-30 ENCOUNTER — Encounter: Payer: Self-pay | Admitting: Endocrinology

## 2017-11-30 ENCOUNTER — Ambulatory Visit: Payer: BLUE CROSS/BLUE SHIELD | Admitting: Endocrinology

## 2017-11-30 VITALS — BP 128/82 | HR 84 | Ht 68.0 in | Wt 241.0 lb

## 2017-11-30 DIAGNOSIS — E1142 Type 2 diabetes mellitus with diabetic polyneuropathy: Secondary | ICD-10-CM | POA: Diagnosis not present

## 2017-11-30 LAB — POCT GLYCOSYLATED HEMOGLOBIN (HGB A1C): Hemoglobin A1C: 6.6 % — AB (ref 4.0–5.6)

## 2017-11-30 MED ORDER — BROMOCRIPTINE MESYLATE 2.5 MG PO TABS: ORAL_TABLET | ORAL | 11 refills | Status: DC

## 2017-11-30 MED ORDER — INSULIN LISPRO PROT & LISPRO (75-25 MIX) 100 UNIT/ML KWIKPEN: 10.0000 [IU] | PEN_INJECTOR | Freq: Two times a day (BID) | SUBCUTANEOUS | 11 refills | Status: DC

## 2017-11-30 NOTE — Patient Instructions (Addendum)
check your blood sugar twice a day.  vary the time of day when you check, between before the 3 meals, and at bedtime.  also check if you have symptoms of your blood sugar being too high or too low.  please keep a record of the readings and bring it to your next appointment here (or you can bring the meter itself).  You can write it on any piece of paper.  please call us sooner if your blood sugar goes below 70, or if you have a lot of readings over 200.  I have sent a prescription to your pharmacy, to add bromocriptine.  We can double this if necessary.   Reduce the insulin to 5 units twice a day on workdays, and 10 units on days off.  Please call or message [place] next week, to tell us how the blood sugar is doing.   If necessary, we can also add "repaglinide,"  To go off the insulin.   Please come back for a follow-up appointment in 2-3 months.
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
## 2017-12-16 DIAGNOSIS — J0140 Acute pansinusitis, unspecified: Secondary | ICD-10-CM | POA: Diagnosis not present

## 2018-01-09 DIAGNOSIS — J00 Acute nasopharyngitis [common cold]: Secondary | ICD-10-CM | POA: Diagnosis not present

## 2018-02-01 ENCOUNTER — Encounter: Payer: Self-pay | Admitting: Endocrinology

## 2018-02-01 ENCOUNTER — Ambulatory Visit: Payer: BLUE CROSS/BLUE SHIELD | Admitting: Endocrinology

## 2018-02-01 VITALS — BP 132/78 | HR 75 | Ht 68.0 in | Wt 241.4 lb

## 2018-02-01 DIAGNOSIS — E1142 Type 2 diabetes mellitus with diabetic polyneuropathy: Secondary | ICD-10-CM | POA: Diagnosis not present

## 2018-02-01 LAB — POCT GLYCOSYLATED HEMOGLOBIN (HGB A1C): Hemoglobin A1C: 7.6 % — AB (ref 4.0–5.6)

## 2018-02-01 MED ORDER — CANAGLIFLOZIN 300 MG PO TABS: 300.0000 mg | ORAL_TABLET | Freq: Every day | ORAL | 11 refills | Status: DC

## 2018-02-01 MED ORDER — BROMOCRIPTINE MESYLATE 2.5 MG PO TABS: 2.5000 mg | ORAL_TABLET | Freq: Every day | ORAL | 11 refills | Status: DC
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
## 2018-02-01 NOTE — Patient Instructions (Addendum)
check your blood sugar twice a day.  vary the time of day when you check, between before the 3 meals, and at bedtime.  also check if you have symptoms of your blood sugar being too high or too low.  please keep a record of the readings and bring it to your next appointment here (or you can bring the meter itself).  You can write it on any piece of paper.  please call us sooner if your blood sugar goes below 70, or if you have a lot of readings over 200.  I have sent a prescription to your pharmacy, to increase the bromocriptine and invokana.   Please continue the same other diabetes medications.   If necessary, we can also add "repaglinide," to go off the insulin.   Please come back for a follow-up appointment in 2-3 months.

## 2018-02-10 DIAGNOSIS — L821 Other seborrheic keratosis: Secondary | ICD-10-CM | POA: Diagnosis not present

## 2018-02-10 DIAGNOSIS — L918 Other hypertrophic disorders of the skin: Secondary | ICD-10-CM | POA: Diagnosis not present

## 2018-02-10 DIAGNOSIS — I788 Other diseases of capillaries: Secondary | ICD-10-CM | POA: Diagnosis not present

## 2018-02-10 DIAGNOSIS — D225 Melanocytic nevi of trunk: Secondary | ICD-10-CM | POA: Diagnosis not present

## 2018-03-04 ENCOUNTER — Telehealth: Payer: Self-pay | Admitting: Endocrinology

## 2018-03-04 MED ORDER — BROMOCRIPTINE MESYLATE 2.5 MG PO TABS: 2.5000 mg | ORAL_TABLET | Freq: Every day | ORAL | 11 refills | Status: DC

## 2018-03-04 NOTE — Telephone Encounter (Signed)
Called pt and made her aware. Verbalized acceptance and understanding. 

## 2018-03-04 NOTE — Telephone Encounter (Signed)
Patient called and stated Bromocriptine has not been amended to reflect new dosage. Please correct and [facility] to [facility].  Not completely out, but will be within 24-48 hours.  [facility] is [facility] on [address]

## 2018-03-04 NOTE — Telephone Encounter (Signed)
Please advise 

## 2018-03-04 NOTE — Telephone Encounter (Signed)
OK, I have sent a prescription to your pharmacy.  

## 2018-04-05 DIAGNOSIS — E559 Vitamin D deficiency, unspecified: Secondary | ICD-10-CM | POA: Diagnosis not present

## 2018-04-05 DIAGNOSIS — E7800 Pure hypercholesterolemia, unspecified: Secondary | ICD-10-CM | POA: Diagnosis not present

## 2018-04-05 DIAGNOSIS — E1140 Type 2 diabetes mellitus with diabetic neuropathy, unspecified: Secondary | ICD-10-CM | POA: Diagnosis not present

## 2018-04-05 DIAGNOSIS — Z0000 Encounter for general adult medical examination without abnormal findings: Secondary | ICD-10-CM | POA: Diagnosis not present

## 2018-04-05 DIAGNOSIS — Z79899 Other long term (current) drug therapy: Secondary | ICD-10-CM | POA: Diagnosis not present

## 2018-04-06 DIAGNOSIS — H40021 Open angle with borderline findings, high risk, right eye: Secondary | ICD-10-CM | POA: Diagnosis not present

## 2018-05-11 ENCOUNTER — Ambulatory Visit: Payer: BLUE CROSS/BLUE SHIELD | Admitting: Endocrinology

## 2018-05-13 DIAGNOSIS — Z96652 Presence of left artificial knee joint: Secondary | ICD-10-CM | POA: Diagnosis not present

## 2018-05-13 DIAGNOSIS — Z96651 Presence of right artificial knee joint: Secondary | ICD-10-CM | POA: Diagnosis not present

## 2018-05-13 DIAGNOSIS — M25561 Pain in right knee: Secondary | ICD-10-CM | POA: Diagnosis not present

## 2018-05-20 DIAGNOSIS — M62838 Other muscle spasm: Secondary | ICD-10-CM | POA: Diagnosis not present

## 2018-05-24 ENCOUNTER — Ambulatory Visit (INDEPENDENT_AMBULATORY_CARE_PROVIDER_SITE_OTHER): Payer: BLUE CROSS/BLUE SHIELD | Admitting: Endocrinology

## 2018-05-24 ENCOUNTER — Other Ambulatory Visit: Payer: Self-pay

## 2018-05-24 DIAGNOSIS — E1142 Type 2 diabetes mellitus with diabetic polyneuropathy: Secondary | ICD-10-CM

## 2018-05-24 NOTE — Patient Instructions (Signed)
Please continue the same medications.  Please come back for a follow-up appointment in 3 months.  
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
## 2018-07-07 ENCOUNTER — Other Ambulatory Visit: Payer: Self-pay | Admitting: Endocrinology

## 2018-08-12 DIAGNOSIS — E1165 Type 2 diabetes mellitus with hyperglycemia: Secondary | ICD-10-CM | POA: Diagnosis not present

## 2018-08-12 DIAGNOSIS — N3000 Acute cystitis without hematuria: Secondary | ICD-10-CM | POA: Diagnosis not present

## 2018-08-20 ENCOUNTER — Other Ambulatory Visit: Payer: Self-pay

## 2018-08-23 ENCOUNTER — Encounter: Payer: Self-pay | Admitting: Endocrinology

## 2018-08-23 ENCOUNTER — Other Ambulatory Visit: Payer: Self-pay

## 2018-08-23 ENCOUNTER — Ambulatory Visit: Payer: BLUE CROSS/BLUE SHIELD | Admitting: Endocrinology

## 2018-08-23 VITALS — BP 126/82 | HR 78 | Temp 98.1°F | Ht 68.0 in | Wt 234.8 lb

## 2018-08-23 DIAGNOSIS — E1142 Type 2 diabetes mellitus with diabetic polyneuropathy: Secondary | ICD-10-CM

## 2018-08-23 MED ORDER — REPAGLINIDE 2 MG PO TABS: 2.0000 mg | ORAL_TABLET | Freq: Three times a day (TID) | ORAL | 11 refills | Status: DC

## 2018-08-23 NOTE — Patient Instructions (Addendum)
I have sent a prescription to your pharmacy, to change the insulin to the repaglinide.   Please continue the same other diabetes medications.   check your blood sugar once a day.  vary the time of day when you check, between before the 3 meals, and at bedtime.  also check if you have symptoms of your blood sugar being too high or too low.  please keep a record of the readings and bring it to your next appointment here (or you can bring the meter itself).  You can write it on any piece of paper.  please call us sooner if your blood sugar goes below 70, or if you have a lot of readings over 200. Please come back for a follow-up appointment in 2 months.
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
## 2018-10-12 DIAGNOSIS — H25013 Cortical age-related cataract, bilateral: Secondary | ICD-10-CM | POA: Diagnosis not present

## 2018-10-12 DIAGNOSIS — E119 Type 2 diabetes mellitus without complications: Secondary | ICD-10-CM | POA: Diagnosis not present

## 2018-10-12 DIAGNOSIS — H43813 Vitreous degeneration, bilateral: Secondary | ICD-10-CM | POA: Diagnosis not present

## 2018-10-12 LAB — HM DIABETES EYE EXAM

## 2018-10-13 DIAGNOSIS — Z6835 Body mass index (BMI) 35.0-35.9, adult: Secondary | ICD-10-CM | POA: Diagnosis not present

## 2018-10-13 DIAGNOSIS — Z01419 Encounter for gynecological examination (general) (routine) without abnormal findings: Secondary | ICD-10-CM | POA: Diagnosis not present

## 2018-10-13 DIAGNOSIS — Z1231 Encounter for screening mammogram for malignant neoplasm of breast: Secondary | ICD-10-CM | POA: Diagnosis not present

## 2018-10-14 DIAGNOSIS — E119 Type 2 diabetes mellitus without complications: Secondary | ICD-10-CM | POA: Diagnosis not present

## 2018-10-21 ENCOUNTER — Other Ambulatory Visit: Payer: Self-pay

## 2018-10-25 ENCOUNTER — Other Ambulatory Visit: Payer: Self-pay

## 2018-10-25 ENCOUNTER — Ambulatory Visit: Payer: BC Managed Care – PPO | Admitting: Endocrinology

## 2018-10-25 ENCOUNTER — Encounter: Payer: Self-pay | Admitting: Endocrinology

## 2018-10-25 VITALS — BP 104/60 | HR 82 | Ht 68.0 in | Wt 232.0 lb

## 2018-10-25 DIAGNOSIS — E1142 Type 2 diabetes mellitus with diabetic polyneuropathy: Secondary | ICD-10-CM

## 2018-10-25 DIAGNOSIS — Z23 Encounter for immunization: Secondary | ICD-10-CM

## 2018-10-25 LAB — POCT GLYCOSYLATED HEMOGLOBIN (HGB A1C): Hemoglobin A1C: 6.2 % — AB (ref 4.0–5.6)

## 2018-10-25 MED ORDER — CANAGLIFLOZIN 100 MG PO TABS: 100.0000 mg | ORAL_TABLET | Freq: Every day | ORAL | 3 refills | Status: DC

## 2018-10-25 MED ORDER — REPAGLINIDE 1 MG PO TABS: 1.0000 mg | ORAL_TABLET | Freq: Three times a day (TID) | ORAL | 3 refills | Status: DC

## 2018-10-25 NOTE — Patient Instructions (Addendum)
Your blood pressure is on the low side today.  Please see Dr Stephanie Acre soon, to have it rechecked. I have sent prescriptions to your pharmacy, to reduce the repaglinide and Invokana.  Please continue the same other diabetes medications.   check your blood sugar once a day.  vary the time of day when you check, between before the 3 meals, and at bedtime.  also check if you have symptoms of your blood sugar being too high or too low.  please keep a record of the readings and bring it to your next appointment here (or you can bring the meter itself).  You can write it on any piece of paper.  please call us sooner if your blood sugar goes below 70, or if you have a lot of readings over 200. Please come back for a follow-up appointment in 3-4 months.
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
## 2019-01-24 ENCOUNTER — Telehealth: Payer: Self-pay

## 2019-01-24 NOTE — Telephone Encounter (Signed)
Patient called in wanting to let Dr know she can no longer afford medication canagliflozin (INVOKANA) 100 MG TABS tablet. Due to turning 63 and now on medicare. Needing an alternative     Please call and advise

## 2019-01-24 NOTE — Telephone Encounter (Signed)
LMTCB to let her know she needs to contact her insurance to see what alternates they will cover

## 2019-02-01 ENCOUNTER — Telehealth: Payer: Self-pay | Admitting: Endocrinology

## 2019-02-01 NOTE — Telephone Encounter (Signed)
Patient called to advise that Invokana is not on an affordable drug tier.  Patient called insurance and was advised that Pioglitazone is on her formulary.  Any questions or for clarification  [phone]

## 2019-02-01 NOTE — Telephone Encounter (Signed)
Please review and advise.

## 2019-02-01 NOTE — Telephone Encounter (Signed)
Please move up appt to next avail, as we have to decide what to change to.

## 2019-02-01 NOTE — Telephone Encounter (Signed)
Dr. Everardo All has reviewed pt request and has advised that an appt is required. Routing this message to the front desk for scheduling purposes.

## 2019-02-03 ENCOUNTER — Telehealth: Payer: Self-pay

## 2019-02-03 NOTE — Telephone Encounter (Signed)
Made in error

## 2019-02-04 ENCOUNTER — Ambulatory Visit (INDEPENDENT_AMBULATORY_CARE_PROVIDER_SITE_OTHER): Payer: Medicare Other | Admitting: Endocrinology

## 2019-02-04 ENCOUNTER — Other Ambulatory Visit: Payer: Self-pay

## 2019-02-04 ENCOUNTER — Encounter: Payer: Self-pay | Admitting: Endocrinology

## 2019-02-04 ENCOUNTER — Ambulatory Visit: Payer: BC Managed Care – PPO | Admitting: Endocrinology

## 2019-02-04 DIAGNOSIS — E1142 Type 2 diabetes mellitus with diabetic polyneuropathy: Secondary | ICD-10-CM | POA: Diagnosis not present

## 2019-02-04 MED ORDER — REPAGLINIDE 2 MG PO TABS: 2.0000 mg | ORAL_TABLET | Freq: Three times a day (TID) | ORAL | 3 refills | Status: DC

## 2019-02-04 NOTE — Patient Instructions (Addendum)
I have sent prescriptions to your pharmacy, to increase the repaglinide, and: You can stay off the Invokana.  Please continue the same other diabetes medications.   check your blood sugar once a day.  vary the time of day when you check, between before the 3 meals, and at bedtime.  also check if you have symptoms of your blood sugar being too high or too low.  please keep a record of the readings and bring it to your next appointment here (or you can bring the meter itself).  You can write it on any piece of paper.  please call us sooner if your blood sugar goes below 70, or if you have a lot of readings over 200. Please come back for a follow-up appointment in 2 months.
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
## 2019-02-10 NOTE — Telephone Encounter (Signed)
LVM requesting patient call back to schedule f/u

## 2019-02-11 NOTE — Telephone Encounter (Signed)
Patient is scheduled for appointment on 03/29/19 at 9:15 a.m.

## 2019-02-28 ENCOUNTER — Ambulatory Visit: Payer: BC Managed Care – PPO | Admitting: Endocrinology

## 2019-03-04 ENCOUNTER — Telehealth: Payer: Self-pay | Admitting: Endocrinology

## 2019-03-04 MED ORDER — CANAGLIFLOZIN 100 MG PO TABS: 100.0000 mg | ORAL_TABLET | Freq: Every day | ORAL | 3 refills | Status: DC

## 2019-03-04 NOTE — Telephone Encounter (Signed)
OK, I have sent a prescription to your pharmacy.  We can do labs at next OV 3/21

## 2019-03-04 NOTE — Telephone Encounter (Signed)
Patient called re: Patient is going to go back on Invokana 100 mg. Patient tried to go without taking in but has decided she does better with taking the Invokana. Previously the cost of the above medication was expensive, however most of the cost was toward patient's deductible. Patient requests to be called at ph# [phone] if Dr. Everardo All would like patient to do prior labs before her appointment on 03/29/19. Patient does not see her PCP until May 17, 2019.

## 2019-03-04 NOTE — Telephone Encounter (Signed)
Please review and advise about labs

## 2019-03-04 NOTE — Telephone Encounter (Signed)
Outpatient Medication Detail   Disp Refills Start End   canagliflozin (INVOKANA) 100 MG TABS tablet 90 tablet 3 03/04/2019    Sig - Route: Take 1 tablet (100 mg total) by mouth daily before breakfast. - Oral   Sent to pharmacy as: canagliflozin (INVOKANA) 100 MG Tab tablet   E-Prescribing Status: Receipt confirmed by pharmacy (03/04/2019  4:40 PM EST)    Called pt and informed her about above refill. Also informed her about Dr. George Hugh decision to wait until [facility] for labs. Verbalized acceptance and understanding.

## 2019-03-25 ENCOUNTER — Other Ambulatory Visit: Payer: Self-pay

## 2019-03-29 ENCOUNTER — Other Ambulatory Visit (HOSPITAL_COMMUNITY): Payer: Self-pay | Admitting: Orthopedic Surgery

## 2019-03-29 ENCOUNTER — Other Ambulatory Visit: Payer: Self-pay

## 2019-03-29 ENCOUNTER — Encounter: Payer: Self-pay | Admitting: Endocrinology

## 2019-03-29 ENCOUNTER — Ambulatory Visit (INDEPENDENT_AMBULATORY_CARE_PROVIDER_SITE_OTHER): Payer: Medicare Other | Admitting: Endocrinology

## 2019-03-29 ENCOUNTER — Other Ambulatory Visit: Payer: Self-pay | Admitting: Orthopedic Surgery

## 2019-03-29 VITALS — BP 112/72 | HR 78 | Ht 68.0 in | Wt 227.2 lb

## 2019-03-29 DIAGNOSIS — E1142 Type 2 diabetes mellitus with diabetic polyneuropathy: Secondary | ICD-10-CM | POA: Diagnosis not present

## 2019-03-29 DIAGNOSIS — M25561 Pain in right knee: Secondary | ICD-10-CM

## 2019-03-29 LAB — POCT GLYCOSYLATED HEMOGLOBIN (HGB A1C): Hemoglobin A1C: 6.9 % — AB (ref 4.0–5.6)

## 2019-03-29 MED ORDER — COLESEVELAM HCL 625 MG PO TABS: 1250.0000 mg | ORAL_TABLET | Freq: Two times a day (BID) | ORAL | 3 refills | Status: DC
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
## 2019-03-29 NOTE — Patient Instructions (Addendum)
I have sent prescriptions to your pharmacy, to add "colesevelam," and: Please continue the same other diabetes medications.   check your blood sugar once a day.  vary the time of day when you check, between before the 3 meals, and at bedtime.  also check if you have symptoms of your blood sugar being too high or too low.  please keep a record of the readings and bring it to your next appointment here (or you can bring the meter itself).  You can write it on any piece of paper.  please call us sooner if your blood sugar goes below 70, or if you have a lot of readings over 200. Please come back for a follow-up appointment in 2-3 months.

## 2019-04-01 ENCOUNTER — Ambulatory Visit (HOSPITAL_COMMUNITY)
Admission: RE | Admit: 2019-04-01 | Discharge: 2019-04-01 | Disposition: A | Discharge status: Home / Self Care | Payer: Medicare Other | Source: Ambulatory Visit | Attending: Orthopedic Surgery | Admitting: Orthopedic Surgery

## 2019-04-01 ENCOUNTER — Other Ambulatory Visit: Payer: Self-pay

## 2019-04-01 DIAGNOSIS — M25561 Pain in right knee: Secondary | ICD-10-CM | POA: Insufficient documentation

## 2019-04-01 MED ORDER — TECHNETIUM TC 99M MEDRONATE IV KIT
20.0000 | PACK | Freq: Once | INTRAVENOUS | Status: AC | PRN
  Administered 2019-04-01: 20 via INTRAVENOUS

## 2019-05-06 ENCOUNTER — Other Ambulatory Visit: Payer: Self-pay | Admitting: Orthopedic Surgery

## 2019-05-06 ENCOUNTER — Telehealth: Payer: Self-pay

## 2019-05-06 NOTE — Telephone Encounter (Signed)
FAXED DOCUMENTS  Company: [facility]  Document: Surgical Clearance Other records requested: None requested  All above requested information has been faxed successfully to the Company listed above. Documents and fax confirmation have been placed in the faxed file for future reference.

## 2019-05-13 NOTE — Patient Instructions (Addendum)
DUE TO COVID-19 ONLY ONE VISITOR IS ALLOWED TO COME WITH YOU AND STAY IN THE WAITING ROOM ONLY DURING PRE OP AND PROCEDURE DAY OF SURGERY. THE 1 VISITOR MAY VISIT WITH YOU AFTER SURGERY IN YOUR PRIVATE ROOM DURING VISITING HOURS ONLY!  YOU NEED TO HAVE A COVID 19 TEST ON: 05/19/19 @  1:00 pm , THIS TEST MUST BE DONE BEFORE SURGERY, COME  [address], [place] , [address].  ([place]) ONCE YOUR COVID TEST IS COMPLETED, PLEASE BEGIN THE QUARANTINE INSTRUCTIONS AS OUTLINED IN YOUR HANDOUT.                JESSIKA ROTHERY   Your procedure is scheduled on: 05/23/19   Report to [facility] Main  Entrance   Report to admitting at: 10:00 AM     Call this number if you have problems the morning of surgery [phone]    Remember:  [place], NO [place].     Take these medicines the morning of surgery with A SIP OF WATER: cetirizine,metoprolol.  How to Manage Your Diabetes Before and After Surgery  Why is it important to control my blood sugar before and after surgery? . Improving blood sugar levels before and after surgery helps healing and can limit problems. . A way of improving blood sugar control is eating a healthy diet by: o  Eating less sugar and carbohydrates o  Increasing activity/exercise o  Talking with your doctor about reaching your blood sugar goals . High blood sugars (greater than 180 mg/dL) can raise your risk of infections and slow your recovery, so you will need to focus on controlling your diabetes during the weeks before surgery. . Make sure that the doctor who takes care of your diabetes knows about your planned surgery including the date and location.  How do I manage my blood sugar before surgery? . Check your blood sugar at least 4 times a day, starting 2 days before surgery, to make sure that the level is not too high or low. o Check your blood sugar the morning of  your surgery when you wake up and every 2 hours until you get to the Short Stay unit. . If your blood sugar is less than 70 mg/dL, you will need to treat for low blood sugar: o Do not take insulin. o Treat a low blood sugar (less than 70 mg/dL) with  cup of clear juice (cranberry or apple), 4 glucose tablets, OR glucose gel. o Recheck blood sugar in 15 minutes after treatment (to make sure it is greater than 70 mg/dL). If your blood sugar is not greater than 70 mg/dL on recheck, call [phone] for further instructions. . Report your blood sugar to the short stay nurse when you get to Short Stay.  . If you are admitted to the hospital after surgery: o Your blood sugar will be checked by the staff and you will probably be given insulin after surgery (instead of oral diabetes medicines) to make sure you have good blood sugar levels. o The goal for blood sugar control after surgery is 80-180 mg/dL.   WHAT DO I DO ABOUT MY DIABETES MEDICATION?  Marland Kitchen Do not take oral diabetes medicines (pills) the morning of surgery.  . THE DAY BEFORE SURGERY, take METFORMIN,OZEMPIC,PRANDIN AS USUAL. DO NOT TAKE INVOCANA.       . THE MORNING OF SURGERY,  DO NOT TAKE  ANY OF YOUR DIABETES  MEDICINE.   DO NOT TAKE ANY DIABETIC MEDICATIONS DAY OF YOUR SURGERY                               You may not have any metal on your body including hair pins and              piercings  Do not wear jewelry, make-up, lotions, powders or perfumes, deodorant             Do not wear nail polish on your fingernails.  Do not shave  48 hours prior to surgery.              Men may shave face and neck.   Do not bring valuables to the hospital. [facility] IS NOT             RESPONSIBLE   FOR VALUABLES.  Contacts, dentures or bridgework may not be worn into surgery.  Leave suitcase in the car. After surgery it may be brought to your room.     Patients discharged the day of surgery will not be allowed to drive home. IF YOU ARE  HAVING SURGERY AND GOING HOME THE SAME DAY, YOU MUST HAVE AN ADULT TO DRIVE YOU HOME AND BE WITH YOU FOR 24 HOURS. YOU MAY GO HOME BY TAXI OR UBER OR ORTHERWISE, BUT AN ADULT MUST ACCOMPANY YOU HOME AND STAY WITH YOU FOR 24 HOURS.  Name and phone number of your driver:  Special Instructions: N/A              Please read over the following fact sheets you were given: _____________________________________________________________________             NO SOLID FOOD AFTER MIDNIGHT THE NIGHT PRIOR TO SURGERY. NOTHING BY MOUTH EXCEPT CLEAR LIQUIDS UNTIL: 9:30 AM . PLEASE FINISH GATORADE DRINK PER SURGEON ORDER  WHICH NEEDS TO BE COMPLETED AT : 9:30 AM.   CLEAR LIQUID DIET   Foods Allowed                                                                     Foods Excluded  Coffee and tea, regular and decaf                             liquids that you cannot  Plain Jell-O any favor except red or purple                                           see through such as: Fruit ices (not with fruit pulp)                                     milk, soups, orange juice  Iced Popsicles  All solid food Carbonated beverages, regular and diet                                    Cranberry, grape and apple juices Sports drinks like Gatorade Lightly seasoned clear broth or consume(fat free) Sugar, honey syrup  Sample Menu Breakfast                                Lunch                                     Supper Cranberry juice                    Beef broth                            Chicken broth Jell-O                                     Grape juice                           Apple juice Coffee or tea                        Jell-O                                      Popsicle                                                Coffee or tea                        Coffee or tea  _____________________________________________________________________  [facility] - Preparing for  Surgery Before surgery, you can play an important role.  Because skin is not sterile, your skin needs to be as free of germs as possible.  You can reduce the number of germs on your skin by washing with CHG (chlorahexidine gluconate) soap before surgery.  CHG is an antiseptic cleaner which kills germs and bonds with the skin to continue killing germs even after washing. Please DO NOT use if you have an allergy to CHG or antibacterial soaps.  If your skin becomes reddened/irritated stop using the CHG and inform your nurse when you arrive at Short Stay. Do not shave (including legs and underarms) for at least 48 hours prior to the first CHG shower.  You may shave your face/neck. Please follow these instructions carefully:  1.  Shower with CHG Soap the night before surgery and the  morning of Surgery.  2.  If you choose to wash your hair, wash your hair first as usual with your  normal  shampoo.  3.  After you shampoo, rinse your hair and body thoroughly to remove the  shampoo.  4.  Use CHG as you would any other liquid soap.  You can apply chg directly  to the skin and wash                       Gently with a scrungie or clean washcloth.  5.  Apply the CHG Soap to your body ONLY FROM THE NECK DOWN.   Do not use on face/ open                           Wound or open sores. Avoid contact with eyes, ears mouth and genitals (private parts).                       Wash face,  Genitals (private parts) with your normal soap.             6.  Wash thoroughly, paying special attention to the area where your surgery  will be performed.  7.  Thoroughly rinse your body with warm water from the neck down.  8.  DO NOT shower/wash with your normal soap after using and rinsing off  the CHG Soap.                9.  Pat yourself dry with a clean towel.            10.  Wear clean pajamas.            11.  Place clean sheets on your bed the night of your first shower and do not  sleep with pets. Day  of Surgery : Do not apply any lotions/deodorants the morning of surgery.  Please wear clean clothes to the hospital/surgery center.  FAILURE TO FOLLOW THESE INSTRUCTIONS MAY RESULT IN THE CANCELLATION OF YOUR SURGERY PATIENT SIGNATURE_________________________________  NURSE SIGNATURE__________________________________  ________________________________________________________________________   Adam Phenix  An incentive spirometer is a tool that can help keep your lungs clear and active. This tool measures how well you are filling your lungs with each breath. Taking long deep breaths may help reverse or decrease the chance of developing breathing (pulmonary) problems (especially infection) following:  A long period of time when you are unable to move or be active. BEFORE THE PROCEDURE   If the spirometer includes an indicator to show your best effort, your nurse or respiratory therapist will set it to a desired goal.  If possible, sit up straight or lean slightly forward. Try not to slouch.  Hold the incentive spirometer in an upright position. INSTRUCTIONS FOR USE  1. Sit on the edge of your bed if possible, or sit up as far as you can in bed or on a chair. 2. Hold the incentive spirometer in an upright position. 3. Breathe out normally. 4. Place the mouthpiece in your mouth and seal your lips tightly around it. 5. Breathe in slowly and as deeply as possible, raising the piston or the ball toward the top of the column. 6. Hold your breath for 3-5 seconds or for as long as possible. Allow the piston or ball to fall to the bottom of the column. 7. Remove the mouthpiece from your mouth and breathe out normally. 8. Rest for a few seconds and repeat Steps 1 through 7 at least 10 times every 1-2 hours when you are awake. Take your time and take a few normal breaths between deep breaths. 9. The spirometer may include an indicator to show  your best effort. Use the indicator as a goal  to work toward during each repetition. 10. After each set of 10 deep breaths, practice coughing to be sure your lungs are clear. If you have an incision (the cut made at the time of surgery), support your incision when coughing by placing a pillow or rolled up towels firmly against it. Once you are able to get out of bed, walk around indoors and cough well. You may stop using the incentive spirometer when instructed by your caregiver.  RISKS AND COMPLICATIONS  Take your time so you do not get dizzy or light-headed.  If you are in pain, you may need to take or ask for pain medication before doing incentive spirometry. It is harder to take a deep breath if you are having pain. AFTER USE  Rest and breathe slowly and easily.  It can be helpful to keep track of a log of your progress. Your caregiver can provide you with a simple table to help with this. If you are using the spirometer at home, follow these instructions: SEEK MEDICAL CARE IF:   You are having difficultly using the spirometer.  You have trouble using the spirometer as often as instructed.  Your pain medication is not giving enough relief while using the spirometer.  You develop fever of 100.5 F (38.1 C) or higher. SEEK IMMEDIATE MEDICAL CARE IF:   You cough up bloody sputum that had not been present before.  You develop fever of 102 F (38.9 C) or greater.  You develop worsening pain at or near the incision site. MAKE SURE YOU:   Understand these instructions.  Will watch your condition.  Will get help right away if you are not doing well or get worse. Document Released: 05/26/2006 Document Revised: 04/07/2011 Document Reviewed: 07/27/2006 [facility] Patient Information 2014 [place], [place].   ________________________________________________________________________

## 2019-05-16 ENCOUNTER — Other Ambulatory Visit: Payer: Self-pay

## 2019-05-16 ENCOUNTER — Encounter (HOSPITAL_COMMUNITY): Payer: Self-pay

## 2019-05-16 ENCOUNTER — Encounter (HOSPITAL_COMMUNITY)
Admission: RE | Admit: 2019-05-16 | Discharge: 2019-05-16 | Disposition: A | Discharge status: Home / Self Care | Payer: Medicare Other | Source: Ambulatory Visit | Attending: Orthopedic Surgery | Admitting: Orthopedic Surgery

## 2019-05-16 DIAGNOSIS — M1711 Unilateral primary osteoarthritis, right knee: Secondary | ICD-10-CM | POA: Diagnosis not present

## 2019-05-16 DIAGNOSIS — Z79899 Other long term (current) drug therapy: Secondary | ICD-10-CM | POA: Insufficient documentation

## 2019-05-16 DIAGNOSIS — Z86718 Personal history of other venous thrombosis and embolism: Secondary | ICD-10-CM | POA: Insufficient documentation

## 2019-05-16 DIAGNOSIS — Z7982 Long term (current) use of aspirin: Secondary | ICD-10-CM | POA: Diagnosis not present

## 2019-05-16 DIAGNOSIS — I10 Essential (primary) hypertension: Secondary | ICD-10-CM | POA: Diagnosis not present

## 2019-05-16 DIAGNOSIS — G4733 Obstructive sleep apnea (adult) (pediatric): Secondary | ICD-10-CM | POA: Diagnosis not present

## 2019-05-16 DIAGNOSIS — Z96651 Presence of right artificial knee joint: Secondary | ICD-10-CM | POA: Diagnosis not present

## 2019-05-16 DIAGNOSIS — Z01812 Encounter for preprocedural laboratory examination: Secondary | ICD-10-CM | POA: Diagnosis not present

## 2019-05-16 DIAGNOSIS — E119 Type 2 diabetes mellitus without complications: Secondary | ICD-10-CM | POA: Diagnosis not present

## 2019-05-16 NOTE — Progress Notes (Signed)
PCP - Dr. Michaelle Birks. LOV: 03/29/19 Cardiologist -   Chest x-ray -  EKG -  Stress Test -  ECHO -  Cardiac Cath -   Sleep Study - yes CPAP - yes  Fasting Blood Sugar - 130's Checks Blood Sugar ___1__ times a day  Blood Thinner Instructions:  Aspirin Instructions: No instructions yet.RN recommended pt. To check with Dr. Wadie Lessen office. Last Dose:  Anesthesia review:   Patient denies shortness of breath, fever, cough and chest pain at PAT appointment   Patient verbalized understanding of instructions that were given to them at the PAT appointment. Patient was also instructed that they will need to review over the PAT instructions again at home before surgery.

## 2019-05-17 ENCOUNTER — Encounter (HOSPITAL_COMMUNITY)
Admission: RE | Admit: 2019-05-17 | Discharge: 2019-05-17 | Disposition: A | Discharge status: Home / Self Care | Payer: Medicare Other | Source: Ambulatory Visit | Attending: Orthopedic Surgery | Admitting: Orthopedic Surgery

## 2019-05-17 ENCOUNTER — Ambulatory Visit (HOSPITAL_COMMUNITY)
Admission: RE | Admit: 2019-05-17 | Discharge: 2019-05-17 | Disposition: A | Discharge status: Home / Self Care | Payer: Medicare Other | Source: Ambulatory Visit | Attending: Orthopedic Surgery | Admitting: Orthopedic Surgery

## 2019-05-17 DIAGNOSIS — I498 Other specified cardiac arrhythmias: Secondary | ICD-10-CM | POA: Diagnosis not present

## 2019-05-17 DIAGNOSIS — Z01818 Encounter for other preprocedural examination: Secondary | ICD-10-CM | POA: Diagnosis not present

## 2019-05-17 DIAGNOSIS — R9431 Abnormal electrocardiogram [ECG] [EKG]: Secondary | ICD-10-CM | POA: Insufficient documentation

## 2019-05-17 LAB — APTT: aPTT: 25 seconds (ref 24–36)

## 2019-05-17 LAB — CBC WITH DIFFERENTIAL/PLATELET
Abs Immature Granulocytes: 0.01 10*3/uL (ref 0.00–0.07)
Basophils Absolute: 0 10*3/uL (ref 0.0–0.1)
Basophils Relative: 1 %
Eosinophils Absolute: 0.2 10*3/uL (ref 0.0–0.5)
Eosinophils Relative: 3 %
HCT: 41.8 % (ref 36.0–46.0)
Hemoglobin: 13.8 g/dL (ref 12.0–15.0)
Immature Granulocytes: 0 %
Lymphocytes Relative: 38 %
Lymphs Abs: 2.1 10*3/uL (ref 0.7–4.0)
MCH: 30.8 pg (ref 26.0–34.0)
MCHC: 33 g/dL (ref 30.0–36.0)
MCV: 93.3 fL (ref 80.0–100.0)
Monocytes Absolute: 0.5 10*3/uL (ref 0.1–1.0)
Monocytes Relative: 10 %
Neutro Abs: 2.6 10*3/uL (ref 1.7–7.7)
Neutrophils Relative %: 48 %
Platelets: 277 10*3/uL (ref 150–400)
RBC: 4.48 MIL/uL (ref 3.87–5.11)
RDW: 13.3 % (ref 11.5–15.5)
WBC: 5.4 10*3/uL (ref 4.0–10.5)
nRBC: 0 % (ref 0.0–0.2)

## 2019-05-17 LAB — URINALYSIS, ROUTINE W REFLEX MICROSCOPIC
Bilirubin Urine: NEGATIVE
Glucose, UA: >=500 mg/dL — AB
Hgb urine dipstick: NEGATIVE
Ketones, ur: NEGATIVE mg/dL
Leukocytes,Ua: NEGATIVE
Nitrite: NEGATIVE
Protein, ur: NEGATIVE mg/dL
Specific Gravity, Urine: 1.009 (ref 1.005–1.030)
pH: 6 (ref 5.0–8.0)

## 2019-05-17 LAB — BASIC METABOLIC PANEL
Anion gap: 13 (ref 5–15)
BUN: 19 mg/dL (ref 8–23)
CO2: 25 mmol/L (ref 22–32)
Calcium: 9.7 mg/dL (ref 8.9–10.3)
Chloride: 101 mmol/L (ref 98–111)
Creatinine, Ser: 1.11 mg/dL — ABNORMAL HIGH (ref 0.44–1.00)
GFR calc Af Amer: >60 mL/min (ref >60)
GFR calc non Af Amer: 52 mL/min — ABNORMAL LOW (ref >60)
Glucose, Bld: 210 mg/dL — ABNORMAL HIGH (ref 70–99)
Potassium: 4.1 mmol/L (ref 3.5–5.1)
Sodium: 139 mmol/L (ref 135–145)

## 2019-05-17 LAB — PROTIME-INR
INR: 1 (ref 0.8–1.2)
Prothrombin Time: 13.1 seconds (ref 11.4–15.2)

## 2019-05-17 LAB — ABO/RH: ABO/RH(D): AB POS

## 2019-05-17 LAB — SURGICAL PCR SCREEN
MRSA, PCR: NEGATIVE
Staphylococcus aureus: NEGATIVE

## 2019-05-17 LAB — GLUCOSE, CAPILLARY: Glucose-Capillary: 222 mg/dL — ABNORMAL HIGH (ref 70–99)

## 2019-05-19 ENCOUNTER — Other Ambulatory Visit (HOSPITAL_COMMUNITY)
Admission: RE | Admit: 2019-05-19 | Discharge: 2019-05-19 | Disposition: A | Discharge status: Home / Self Care | Payer: Medicare Other | Source: Ambulatory Visit | Attending: Orthopedic Surgery | Admitting: Orthopedic Surgery

## 2019-05-19 DIAGNOSIS — Z01812 Encounter for preprocedural laboratory examination: Secondary | ICD-10-CM | POA: Diagnosis present

## 2019-05-19 DIAGNOSIS — Z20822 Contact with and (suspected) exposure to covid-19: Secondary | ICD-10-CM | POA: Insufficient documentation

## 2019-05-19 LAB — SARS CORONAVIRUS 2 (TAT 6-24 HRS): SARS Coronavirus 2: NEGATIVE

## 2019-05-20 ENCOUNTER — Ambulatory Visit (INDEPENDENT_AMBULATORY_CARE_PROVIDER_SITE_OTHER): Payer: Medicare Other | Admitting: Cardiovascular Disease

## 2019-05-20 ENCOUNTER — Other Ambulatory Visit: Payer: Self-pay

## 2019-05-20 ENCOUNTER — Encounter: Payer: Self-pay | Admitting: Cardiovascular Disease

## 2019-05-20 VITALS — BP 120/70 | HR 72 | Ht 68.5 in | Wt 223.0 lb

## 2019-05-20 DIAGNOSIS — R9431 Abnormal electrocardiogram [ECG] [EKG]: Secondary | ICD-10-CM | POA: Diagnosis not present

## 2019-05-20 DIAGNOSIS — I10 Essential (primary) hypertension: Secondary | ICD-10-CM

## 2019-05-20 DIAGNOSIS — Z01818 Encounter for other preprocedural examination: Secondary | ICD-10-CM

## 2019-05-20 DIAGNOSIS — T8484XA Pain due to internal orthopedic prosthetic devices, implants and grafts, initial encounter: Secondary | ICD-10-CM | POA: Diagnosis present
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
## 2019-05-20 NOTE — Patient Instructions (Signed)
Medication Instructions:  Your physician recommends that you continue on your current medications as directed. Please refer to the Current Medication list given to you today.  *If you need a refill on your cardiac medications before your next appointment, please call your pharmacy*   Lab Work: None Ordered If you have labs (blood work) drawn today and your tests are completely normal, you will receive your results only by: . MyChart Message (if you have MyChart) OR . A paper copy in the mail If you have any lab test that is abnormal or we need to change your treatment, we will call you to review the results.   Testing/Procedures: None Ordered   Follow-Up: At [facility], you and your health needs are our priority.  As part of our continuing mission to provide you with exceptional heart care, we have created designated Provider Care Teams.  These Care Teams include your primary Cardiologist (physician) and Advanced Practice Providers (APPs -  Physician Assistants and Nurse Practitioners) who all work together to provide you with the care you need, when you need it.  We recommend signing up for the patient portal called "MyChart".  Sign up information is provided on this After Visit Summary.  MyChart is used to connect with patients for Virtual Visits (Telemedicine).  Patients are able to view lab/test results, encounter notes, upcoming appointments, etc.  Non-urgent messages can be sent to your provider as well.   To learn more about what you can do with MyChart, go to [email or web address].    Your next appointment:    As Needed  The format for your next appointment:   Either In Person or Virtual  Provider:   You may see Philip Nahser, MD or one of the following Advanced Practice Providers on your designated Care Team:    Scott Weaver, PA-C  Vin Bhagat, PA-C  Janine Hammond, NP     
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
## 2019-05-22 MED ORDER — TRANEXAMIC ACID 1000 MG/10ML IV SOLN
2000.0000 mg | INTRAVENOUS | Status: DC
  Filled 2019-05-22: qty 20

## 2019-05-22 MED ORDER — BUPIVACAINE LIPOSOME 1.3 % IJ SUSP
20.0000 mL | Freq: Once | INTRAMUSCULAR | Status: DC
  Filled 2019-05-22: qty 20

## 2019-05-22 MED ORDER — VANCOMYCIN HCL 1500 MG/300ML IV SOLN
1500.0000 mg | INTRAVENOUS | Status: AC
  Administered 2019-05-23: 1500 mg via INTRAVENOUS
  Filled 2019-05-22: qty 300

## 2019-05-23 ENCOUNTER — Encounter (HOSPITAL_COMMUNITY): Payer: Self-pay | Admitting: Orthopedic Surgery

## 2019-05-23 ENCOUNTER — Ambulatory Visit (HOSPITAL_COMMUNITY): Payer: Medicare Other | Admitting: Anesthesiology

## 2019-05-23 ENCOUNTER — Observation Stay (HOSPITAL_COMMUNITY)
Admission: RE | Admit: 2019-05-23 | Discharge: 2019-05-24 | Disposition: A | Discharge status: Home / Self Care | Payer: Medicare Other | Source: Ambulatory Visit | Attending: Orthopedic Surgery | Admitting: Orthopedic Surgery

## 2019-05-23 ENCOUNTER — Encounter (HOSPITAL_COMMUNITY)
Admission: RE | Disposition: A | Discharge status: Home / Self Care | Payer: Self-pay | Source: Ambulatory Visit | Attending: Orthopedic Surgery

## 2019-05-23 DIAGNOSIS — Y838 Other surgical procedures as the cause of abnormal reaction of the patient, or of later complication, without mention of misadventure at the time of the procedure: Secondary | ICD-10-CM | POA: Diagnosis not present

## 2019-05-23 DIAGNOSIS — I10 Essential (primary) hypertension: Secondary | ICD-10-CM | POA: Insufficient documentation

## 2019-05-23 DIAGNOSIS — Z7984 Long term (current) use of oral hypoglycemic drugs: Secondary | ICD-10-CM | POA: Diagnosis not present

## 2019-05-23 DIAGNOSIS — F329 Major depressive disorder, single episode, unspecified: Secondary | ICD-10-CM | POA: Diagnosis not present

## 2019-05-23 DIAGNOSIS — M1711 Unilateral primary osteoarthritis, right knee: Secondary | ICD-10-CM | POA: Insufficient documentation

## 2019-05-23 DIAGNOSIS — Z79899 Other long term (current) drug therapy: Secondary | ICD-10-CM | POA: Diagnosis not present

## 2019-05-23 DIAGNOSIS — K760 Fatty (change of) liver, not elsewhere classified: Secondary | ICD-10-CM | POA: Diagnosis not present

## 2019-05-23 DIAGNOSIS — G4733 Obstructive sleep apnea (adult) (pediatric): Secondary | ICD-10-CM | POA: Diagnosis not present

## 2019-05-23 DIAGNOSIS — T8484XA Pain due to internal orthopedic prosthetic devices, implants and grafts, initial encounter: Secondary | ICD-10-CM | POA: Diagnosis present

## 2019-05-23 DIAGNOSIS — E785 Hyperlipidemia, unspecified: Secondary | ICD-10-CM | POA: Insufficient documentation

## 2019-05-23 DIAGNOSIS — T84032A Mechanical loosening of internal right knee prosthetic joint, initial encounter: Secondary | ICD-10-CM | POA: Diagnosis not present

## 2019-05-23 DIAGNOSIS — Z886 Allergy status to analgesic agent status: Secondary | ICD-10-CM | POA: Diagnosis not present

## 2019-05-23 DIAGNOSIS — Z880 Allergy status to penicillin: Secondary | ICD-10-CM | POA: Diagnosis not present

## 2019-05-23 DIAGNOSIS — E119 Type 2 diabetes mellitus without complications: Secondary | ICD-10-CM | POA: Insufficient documentation

## 2019-05-23 DIAGNOSIS — Y793 Surgical instruments, materials and orthopedic devices (including sutures) associated with adverse incidents: Secondary | ICD-10-CM | POA: Diagnosis not present

## 2019-05-23 DIAGNOSIS — Z7982 Long term (current) use of aspirin: Secondary | ICD-10-CM | POA: Diagnosis not present

## 2019-05-23 DIAGNOSIS — Z96651 Presence of right artificial knee joint: Secondary | ICD-10-CM

## 2019-05-23 HISTORY — PX: TOTAL KNEE REVISION: SHX996

## 2019-05-23 LAB — HEMOGLOBIN A1C
Hgb A1c MFr Bld: 7.1 % — ABNORMAL HIGH (ref 4.8–5.6)
Mean Plasma Glucose: 157.07 mg/dL

## 2019-05-23 LAB — TYPE AND SCREEN
ABO/RH(D): AB POS
Antibody Screen: NEGATIVE

## 2019-05-23 LAB — GLUCOSE, CAPILLARY
Glucose-Capillary: 165 mg/dL — ABNORMAL HIGH (ref 70–99)
Glucose-Capillary: 125 mg/dL — ABNORMAL HIGH (ref 70–99)
Glucose-Capillary: 193 mg/dL — ABNORMAL HIGH (ref 70–99)
Glucose-Capillary: 237 mg/dL — ABNORMAL HIGH (ref 70–99)

## 2019-05-23 SURGERY — TOTAL KNEE REVISION
Anesthesia: Regional | Site: Knee | Laterality: Right

## 2019-05-23 MED ORDER — BUPIVACAINE-EPINEPHRINE (PF) 0.5% -1:200000 IJ SOLN
INTRAMUSCULAR | Status: DC | PRN
  Administered 2019-05-23: 30 mL via PERINEURAL

## 2019-05-23 MED ORDER — LIDOCAINE 2% (20 MG/ML) 5 ML SYRINGE
INTRAMUSCULAR | Status: AC
  Filled 2019-05-23: qty 5

## 2019-05-23 MED ORDER — ALBUMIN HUMAN 5 % IV SOLN
INTRAVENOUS | Status: AC
  Filled 2019-05-23: qty 250

## 2019-05-23 MED ORDER — DIPHENHYDRAMINE HCL 50 MG/ML IJ SOLN
INTRAMUSCULAR | Status: DC | PRN
  Administered 2019-05-23: 12 mg via INTRAVENOUS

## 2019-05-23 MED ORDER — VITAMIN E 180 MG (400 UNIT) PO CAPS
400.0000 [IU] | ORAL_CAPSULE | Freq: Two times a day (BID) | ORAL | Status: DC
  Administered 2019-05-23: 400 [IU] via ORAL
  Filled 2019-05-23 (×2): qty 1

## 2019-05-23 MED ORDER — DEXAMETHASONE SODIUM PHOSPHATE 10 MG/ML IJ SOLN
INTRAMUSCULAR | Status: AC
  Filled 2019-05-23: qty 1

## 2019-05-23 MED ORDER — TIZANIDINE HCL 2 MG PO TABS
2.0000 mg | ORAL_TABLET | Freq: Four times a day (QID) | ORAL | 0 refills | Status: DC | PRN
Start: 2019-05-23 — End: 2021-05-14

## 2019-05-23 MED ORDER — BUPIVACAINE-EPINEPHRINE (PF) 0.5% -1:200000 IJ SOLN
INTRAMUSCULAR | Status: AC
  Filled 2019-05-23: qty 30

## 2019-05-23 MED ORDER — BUPIVACAINE LIPOSOME 1.3 % IJ SUSP
INTRAMUSCULAR | Status: DC | PRN
  Administered 2019-05-23: 20 mL

## 2019-05-23 MED ORDER — ONDANSETRON HCL 4 MG PO TABS: 4.0000 mg | ORAL_TABLET | Freq: Four times a day (QID) | ORAL | Status: DC | PRN

## 2019-05-23 MED ORDER — CANAGLIFLOZIN 100 MG PO TABS
100.0000 mg | ORAL_TABLET | Freq: Every day | ORAL | Status: DC
  Administered 2019-05-24: 100 mg via ORAL
  Filled 2019-05-23: qty 1

## 2019-05-23 MED ORDER — FENTANYL CITRATE (PF) 100 MCG/2ML IJ SOLN
INTRAMUSCULAR | Status: AC
  Filled 2019-05-23: qty 2

## 2019-05-23 MED ORDER — TRANEXAMIC ACID-NACL 1000-0.7 MG/100ML-% IV SOLN
1000.0000 mg | Freq: Once | INTRAVENOUS | Status: AC
  Administered 2019-05-23: 17:00:00 1000 mg via INTRAVENOUS
  Filled 2019-05-23: qty 100

## 2019-05-23 MED ORDER — FLEET ENEMA 7-19 GM/118ML RE ENEM: 1.0000 | ENEMA | Freq: Once | RECTAL | Status: DC | PRN

## 2019-05-23 MED ORDER — PANTOPRAZOLE SODIUM 40 MG PO TBEC
40.0000 mg | DELAYED_RELEASE_TABLET | Freq: Every day | ORAL | Status: DC
  Administered 2019-05-23 – 2019-05-24 (×2): 40 mg via ORAL
  Filled 2019-05-23 (×2): qty 1

## 2019-05-23 MED ORDER — REPAGLINIDE 1 MG PO TABS
2.0000 mg | ORAL_TABLET | Freq: Three times a day (TID) | ORAL | Status: DC
  Filled 2019-05-23 (×2): qty 2
  Filled 2019-05-23: qty 1

## 2019-05-23 MED ORDER — OXYCODONE HCL 5 MG PO TABS
5.0000 mg | ORAL_TABLET | ORAL | Status: DC | PRN
  Administered 2019-05-23 – 2019-05-24 (×4): 10 mg via ORAL
  Filled 2019-05-23 (×4): qty 2

## 2019-05-23 MED ORDER — FOLIC ACID 1 MG PO TABS
4000.0000 ug | ORAL_TABLET | Freq: Every day | ORAL | Status: DC
  Administered 2019-05-23: 17:00:00 4 mg via ORAL
  Filled 2019-05-23 (×2): qty 4

## 2019-05-23 MED ORDER — MENTHOL 3 MG MT LOZG: 1.0000 | LOZENGE | OROMUCOSAL | Status: DC | PRN

## 2019-05-23 MED ORDER — SIMVASTATIN 20 MG PO TABS
20.0000 mg | ORAL_TABLET | Freq: Every day | ORAL | Status: DC
  Administered 2019-05-23: 20 mg via ORAL
  Filled 2019-05-23: qty 1

## 2019-05-23 MED ORDER — SEMAGLUTIDE (1 MG/DOSE) 2 MG/1.5ML ~~LOC~~ SOPN: 1.0000 mg | PEN_INJECTOR | SUBCUTANEOUS | Status: DC

## 2019-05-23 MED ORDER — SALINE SPRAY 0.65 % NA SOLN
1.0000 | Freq: Two times a day (BID) | NASAL | Status: DC | PRN
  Filled 2019-05-23: qty 44

## 2019-05-23 MED ORDER — VITAMIN B-12 1000 MCG PO TABS
1000.0000 ug | ORAL_TABLET | Freq: Every day | ORAL | Status: DC
  Administered 2019-05-23: 17:00:00 1000 ug via ORAL
  Filled 2019-05-23 (×2): qty 1

## 2019-05-23 MED ORDER — DEXAMETHASONE SODIUM PHOSPHATE 4 MG/ML IJ SOLN
INTRAMUSCULAR | Status: DC | PRN
  Administered 2019-05-23: 4 mg via INTRAVENOUS

## 2019-05-23 MED ORDER — SODIUM CHLORIDE (PF) 0.9 % IJ SOLN
INTRAMUSCULAR | Status: AC
  Filled 2019-05-23: qty 50

## 2019-05-23 MED ORDER — VITAMIN D 25 MCG (1000 UNIT) PO TABS
5000.0000 [IU] | ORAL_TABLET | Freq: Every day | ORAL | Status: DC
  Administered 2019-05-23: 5000 [IU] via ORAL
  Filled 2019-05-23 (×2): qty 5

## 2019-05-23 MED ORDER — BISACODYL 5 MG PO TBEC: 5.0000 mg | DELAYED_RELEASE_TABLET | Freq: Every day | ORAL | Status: DC | PRN

## 2019-05-23 MED ORDER — HYDROMORPHONE HCL 1 MG/ML IJ SOLN: 0.5000 mg | INTRAMUSCULAR | Status: DC | PRN

## 2019-05-23 MED ORDER — ONDANSETRON HCL 4 MG/2ML IJ SOLN: 4.0000 mg | Freq: Four times a day (QID) | INTRAMUSCULAR | Status: DC | PRN

## 2019-05-23 MED ORDER — OXYCODONE-ACETAMINOPHEN 5-325 MG PO TABS: 1.0000 | ORAL_TABLET | ORAL | 0 refills | Status: DC | PRN

## 2019-05-23 MED ORDER — PHENYLEPHRINE 40 MCG/ML (10ML) SYRINGE FOR IV PUSH (FOR BLOOD PRESSURE SUPPORT)
PREFILLED_SYRINGE | INTRAVENOUS | Status: DC | PRN
  Administered 2019-05-23: 80 ug via INTRAVENOUS
  Administered 2019-05-23 (×3): 40 ug via INTRAVENOUS

## 2019-05-23 MED ORDER — HYDROCHLOROTHIAZIDE 12.5 MG PO CAPS
12.5000 mg | ORAL_CAPSULE | Freq: Every day | ORAL | Status: DC
  Administered 2019-05-24: 12.5 mg via ORAL
  Filled 2019-05-23: qty 1

## 2019-05-23 MED ORDER — METOPROLOL TARTRATE 25 MG PO TABS
25.0000 mg | ORAL_TABLET | Freq: Two times a day (BID) | ORAL | Status: DC
  Administered 2019-05-23 – 2019-05-24 (×2): 25 mg via ORAL
  Filled 2019-05-23 (×2): qty 1

## 2019-05-23 MED ORDER — METOCLOPRAMIDE HCL 5 MG/ML IJ SOLN: 5.0000 mg | Freq: Three times a day (TID) | INTRAMUSCULAR | Status: DC | PRN

## 2019-05-23 MED ORDER — BIOTIN 10000 MCG PO TABS: 10000.0000 ug | ORAL_TABLET | Freq: Every day | ORAL | Status: DC

## 2019-05-23 MED ORDER — ASPIRIN 81 MG PO CHEW
81.0000 mg | CHEWABLE_TABLET | Freq: Two times a day (BID) | ORAL | Status: DC
  Administered 2019-05-23 – 2019-05-24 (×2): 81 mg via ORAL
  Filled 2019-05-23 (×2): qty 1

## 2019-05-23 MED ORDER — LIDOCAINE HCL (CARDIAC) PF 100 MG/5ML IV SOSY
PREFILLED_SYRINGE | INTRAVENOUS | Status: DC | PRN
  Administered 2019-05-23: 10 mg via INTRAVENOUS
  Administered 2019-05-23: 50 mg via INTRAVENOUS

## 2019-05-23 MED ORDER — ALUM & MAG HYDROXIDE-SIMETH 200-200-20 MG/5ML PO SUSP: 30.0000 mL | ORAL | Status: DC | PRN

## 2019-05-23 MED ORDER — ACETAMINOPHEN 500 MG PO TABS
1000.0000 mg | ORAL_TABLET | Freq: Once | ORAL | Status: AC
  Administered 2019-05-23: 1000 mg via ORAL
  Filled 2019-05-23: qty 2

## 2019-05-23 MED ORDER — INSULIN ASPART 100 UNIT/ML ~~LOC~~ SOLN
0.0000 [IU] | Freq: Three times a day (TID) | SUBCUTANEOUS | Status: DC
  Administered 2019-05-23 – 2019-05-24 (×2): 3 [IU] via SUBCUTANEOUS

## 2019-05-23 MED ORDER — PROPOFOL 500 MG/50ML IV EMUL
INTRAVENOUS | Status: DC | PRN
  Administered 2019-05-23: 30 ug/kg/min via INTRAVENOUS

## 2019-05-23 MED ORDER — TRANEXAMIC ACID-NACL 1000-0.7 MG/100ML-% IV SOLN
1000.0000 mg | INTRAVENOUS | Status: AC
  Administered 2019-05-23: 1000 mg via INTRAVENOUS
  Filled 2019-05-23: qty 100

## 2019-05-23 MED ORDER — MIDAZOLAM HCL 2 MG/2ML IJ SOLN
INTRAMUSCULAR | Status: AC
  Filled 2019-05-23: qty 2

## 2019-05-23 MED ORDER — PROPOFOL 10 MG/ML IV BOLUS
INTRAVENOUS | Status: AC
  Filled 2019-05-23: qty 20

## 2019-05-23 MED ORDER — LISINOPRIL-HYDROCHLOROTHIAZIDE 10-12.5 MG PO TABS: 1.0000 | ORAL_TABLET | Freq: Every day | ORAL | Status: DC

## 2019-05-23 MED ORDER — ONDANSETRON HCL 4 MG/2ML IJ SOLN
INTRAMUSCULAR | Status: AC
  Filled 2019-05-23: qty 2

## 2019-05-23 MED ORDER — FENTANYL CITRATE (PF) 100 MCG/2ML IJ SOLN
50.0000 ug | Freq: Once | INTRAMUSCULAR | Status: DC
  Filled 2019-05-23: qty 2

## 2019-05-23 MED ORDER — POLYVINYL ALCOHOL 1.4 % OP SOLN
1.0000 [drp] | Freq: Two times a day (BID) | OPHTHALMIC | Status: DC
  Administered 2019-05-23 – 2019-05-24 (×2): 1 [drp] via OPHTHALMIC
  Filled 2019-05-23: qty 15

## 2019-05-23 MED ORDER — ONDANSETRON HCL 4 MG/2ML IJ SOLN
INTRAMUSCULAR | Status: DC | PRN
  Administered 2019-05-23: 4 mg via INTRAVENOUS

## 2019-05-23 MED ORDER — BUPIVACAINE IN DEXTROSE 0.75-8.25 % IT SOLN
INTRATHECAL | Status: DC | PRN
  Administered 2019-05-23: 15 mg via INTRATHECAL

## 2019-05-23 MED ORDER — FENTANYL CITRATE (PF) 100 MCG/2ML IJ SOLN: 25.0000 ug | INTRAMUSCULAR | Status: DC | PRN

## 2019-05-23 MED ORDER — STERILE WATER FOR IRRIGATION IR SOLN
Status: DC | PRN
  Administered 2019-05-23: 2000 mL

## 2019-05-23 MED ORDER — PROPOFOL 1000 MG/100ML IV EMUL
INTRAVENOUS | Status: AC
  Filled 2019-05-23: qty 100

## 2019-05-23 MED ORDER — PHENYLEPHRINE HCL (PRESSORS) 10 MG/ML IV SOLN
INTRAVENOUS | Status: AC
  Filled 2019-05-23: qty 1

## 2019-05-23 MED ORDER — FENTANYL CITRATE (PF) 100 MCG/2ML IJ SOLN
INTRAMUSCULAR | Status: DC | PRN
  Administered 2019-05-23: 50 ug via INTRAVENOUS

## 2019-05-23 MED ORDER — MIDAZOLAM HCL 5 MG/5ML IJ SOLN
INTRAMUSCULAR | Status: DC | PRN
  Administered 2019-05-23 (×2): 1 mg via INTRAVENOUS

## 2019-05-23 MED ORDER — ACETAMINOPHEN 325 MG PO TABS
325.0000 mg | ORAL_TABLET | Freq: Four times a day (QID) | ORAL | Status: DC | PRN
  Administered 2019-05-23 – 2019-05-24 (×2): 650 mg via ORAL
  Filled 2019-05-23 (×3): qty 2

## 2019-05-23 MED ORDER — ONDANSETRON HCL 4 MG/2ML IJ SOLN: 4.0000 mg | Freq: Once | INTRAMUSCULAR | Status: DC | PRN

## 2019-05-23 MED ORDER — MIDAZOLAM HCL 2 MG/2ML IJ SOLN
1.0000 mg | Freq: Once | INTRAMUSCULAR | Status: AC
  Administered 2019-05-23: 1 mg via INTRAVENOUS
  Filled 2019-05-23: qty 2

## 2019-05-23 MED ORDER — PROPOFOL 10 MG/ML IV BOLUS
INTRAVENOUS | Status: DC | PRN
  Administered 2019-05-23 (×2): 20 mg via INTRAVENOUS
  Administered 2019-05-23: 30 mg via INTRAVENOUS

## 2019-05-23 MED ORDER — DIPHENHYDRAMINE HCL 50 MG/ML IJ SOLN
INTRAMUSCULAR | Status: AC
  Filled 2019-05-23: qty 1

## 2019-05-23 MED ORDER — DOCUSATE SODIUM 100 MG PO CAPS
100.0000 mg | ORAL_CAPSULE | Freq: Two times a day (BID) | ORAL | Status: DC
  Administered 2019-05-23 – 2019-05-24 (×2): 100 mg via ORAL
  Filled 2019-05-23 (×2): qty 1

## 2019-05-23 MED ORDER — SODIUM CHLORIDE (PF) 0.9 % IJ SOLN
INTRAMUSCULAR | Status: DC | PRN
  Administered 2019-05-23: 50 mL via INTRAVENOUS

## 2019-05-23 MED ORDER — METOCLOPRAMIDE HCL 5 MG PO TABS: 5.0000 mg | ORAL_TABLET | Freq: Three times a day (TID) | ORAL | Status: DC | PRN

## 2019-05-23 MED ORDER — DIPHENHYDRAMINE HCL 12.5 MG/5ML PO ELIX: 12.5000 mg | ORAL_SOLUTION | ORAL | Status: DC | PRN

## 2019-05-23 MED ORDER — FENOFIBRATE 160 MG PO TABS
160.0000 mg | ORAL_TABLET | Freq: Every day | ORAL | Status: DC
  Administered 2019-05-23 – 2019-05-24 (×2): 160 mg via ORAL
  Filled 2019-05-23 (×2): qty 1

## 2019-05-23 MED ORDER — LACTATED RINGERS IV SOLN: INTRAVENOUS | Status: DC

## 2019-05-23 MED ORDER — METHOCARBAMOL 500 MG PO TABS
500.0000 mg | ORAL_TABLET | Freq: Four times a day (QID) | ORAL | Status: DC | PRN
  Administered 2019-05-24 (×2): 500 mg via ORAL
  Filled 2019-05-23 (×2): qty 1

## 2019-05-23 MED ORDER — ROPIVACAINE HCL 5 MG/ML IJ SOLN
INTRAMUSCULAR | Status: DC | PRN
  Administered 2019-05-23: 30 mL via EPIDURAL

## 2019-05-23 MED ORDER — CALCIUM CARBONATE-VITAMIN D 500-200 MG-UNIT PO TABS
1.0000 | ORAL_TABLET | Freq: Two times a day (BID) | ORAL | Status: DC
  Filled 2019-05-23: qty 1

## 2019-05-23 MED ORDER — POLYETHYLENE GLYCOL 3350 17 G PO PACK: 17.0000 g | PACK | Freq: Every day | ORAL | Status: DC | PRN

## 2019-05-23 MED ORDER — OMEGA-3-ACID ETHYL ESTERS 1 G PO CAPS
1.0000 | ORAL_CAPSULE | Freq: Two times a day (BID) | ORAL | Status: DC
  Filled 2019-05-23: qty 1

## 2019-05-23 MED ORDER — MILK THISTLE 150 MG PO CAPS: 150.0000 mg | ORAL_CAPSULE | Freq: Two times a day (BID) | ORAL | Status: DC

## 2019-05-23 MED ORDER — ASPIRIN EC 81 MG PO TBEC
81.0000 mg | DELAYED_RELEASE_TABLET | Freq: Two times a day (BID) | ORAL | 0 refills | Status: DC
Start: 2019-05-23 — End: 2023-03-02

## 2019-05-23 MED ORDER — PHENOL 1.4 % MT LIQD: 1.0000 | OROMUCOSAL | Status: DC | PRN

## 2019-05-23 MED ORDER — POVIDONE-IODINE 10 % EX SWAB
2.0000 "application " | Freq: Once | CUTANEOUS | Status: AC
  Administered 2019-05-23: 2 via TOPICAL

## 2019-05-23 MED ORDER — UBIQUINOL 100 MG PO CAPS: 100.0000 mg | ORAL_CAPSULE | Freq: Every day | ORAL | Status: DC

## 2019-05-23 MED ORDER — METHOCARBAMOL 500 MG IVPB - SIMPLE MED
500.0000 mg | Freq: Four times a day (QID) | INTRAVENOUS | Status: DC | PRN
  Filled 2019-05-23: qty 50

## 2019-05-23 MED ORDER — ZOLPIDEM TARTRATE 5 MG PO TABS: 5.0000 mg | ORAL_TABLET | Freq: Every evening | ORAL | Status: DC | PRN

## 2019-05-23 MED ORDER — 0.9 % SODIUM CHLORIDE (POUR BTL) OPTIME
TOPICAL | Status: DC | PRN
  Administered 2019-05-23: 1000 mL

## 2019-05-23 MED ORDER — LORATADINE 10 MG PO TABS
10.0000 mg | ORAL_TABLET | Freq: Every day | ORAL | Status: DC
  Administered 2019-05-23 – 2019-05-24 (×2): 10 mg via ORAL
  Filled 2019-05-23 (×2): qty 1

## 2019-05-23 MED ORDER — METFORMIN HCL ER 500 MG PO TB24
1000.0000 mg | ORAL_TABLET | Freq: Two times a day (BID) | ORAL | Status: DC
  Administered 2019-05-24: 09:00:00 1000 mg via ORAL
  Filled 2019-05-23: qty 2

## 2019-05-23 MED ORDER — LISINOPRIL 10 MG PO TABS
10.0000 mg | ORAL_TABLET | Freq: Every day | ORAL | Status: DC
  Administered 2019-05-24: 10 mg via ORAL
  Filled 2019-05-23: qty 1

## 2019-05-23 MED ORDER — GABAPENTIN 100 MG PO CAPS
100.0000 mg | ORAL_CAPSULE | Freq: Three times a day (TID) | ORAL | Status: DC
  Administered 2019-05-23 – 2019-05-24 (×3): 100 mg via ORAL
  Filled 2019-05-23 (×3): qty 1

## 2019-05-23 MED ORDER — SODIUM CHLORIDE 0.9 % IR SOLN
Status: DC | PRN
  Administered 2019-05-23: 3000 mL

## 2019-05-23 MED ORDER — KCL IN DEXTROSE-NACL 20-5-0.45 MEQ/L-%-% IV SOLN
INTRAVENOUS | Status: DC
  Filled 2019-05-23 (×2): qty 1000

## 2019-05-23 SURGICAL SUPPLY — 64 items
ATTUNE PSRP INSR SZ6 7 KNEE (Insert) ×1 IMPLANT
BAG DECANTER FOR FLEXI CONT (MISCELLANEOUS) ×2 IMPLANT
BAG SPEC THK2 15X12 ZIP CLS (MISCELLANEOUS) ×1
BAG ZIPLOCK 12X15 (MISCELLANEOUS) ×2 IMPLANT
BLADE OSCILLATING/SAGITTAL (BLADE) ×4
BLADE SAG 18X100X1.27 (BLADE) ×2 IMPLANT
BLADE SAW SAG 90X13X1.27 (BLADE) ×2 IMPLANT
BLADE SAW SGTL 81X20 HD (BLADE) ×2 IMPLANT
BLADE SURG SZ10 CARB STEEL (BLADE) ×4 IMPLANT
BLADE SW THK.38XMED LNG THN (BLADE) ×2 IMPLANT
BNDG CMPR MED 10X6 ELC LF (GAUZE/BANDAGES/DRESSINGS) ×1
BNDG ELASTIC 6X10 VLCR STRL LF (GAUZE/BANDAGES/DRESSINGS) ×3 IMPLANT
BOWL SMART MIX CTS (DISPOSABLE) ×2 IMPLANT
BRUSH FEMORAL CANAL (MISCELLANEOUS) ×2 IMPLANT
BSPLAT TIB 5 CMNT REV ROT PLAT (Orthopedic Implant) ×1 IMPLANT
BUR OVAL CARBIDE 4.0 (BURR) IMPLANT
CEMENT HV SMART SET (Cement) ×4 IMPLANT
COVER SURGICAL LIGHT HANDLE (MISCELLANEOUS) ×2 IMPLANT
COVER WAND RF STERILE (DRAPES) IMPLANT
CUFF TOURN SGL QUICK 34 (TOURNIQUET CUFF) ×2
CUFF TRNQT CYL 34X4.125X (TOURNIQUET CUFF) ×1 IMPLANT
DECANTER SPIKE VIAL GLASS SM (MISCELLANEOUS) ×6 IMPLANT
DRAPE ORTHO SPLIT 77X108 STRL (DRAPES) ×4
DRAPE SURG ORHT 6 SPLT 77X108 (DRAPES) ×2 IMPLANT
DRAPE U-SHAPE 47X51 STRL (DRAPES) ×2 IMPLANT
DRESSING AQUACEL AG SP 3.5X10 (GAUZE/BANDAGES/DRESSINGS) IMPLANT
DRSG AQUACEL AG ADV 3.5X10 (GAUZE/BANDAGES/DRESSINGS) ×1 IMPLANT
DRSG AQUACEL AG ADV 3.5X14 (GAUZE/BANDAGES/DRESSINGS) IMPLANT
DRSG AQUACEL AG SP 3.5X10 (GAUZE/BANDAGES/DRESSINGS)
DURAPREP 26ML APPLICATOR (WOUND CARE) ×2 IMPLANT
ELECT REM PT RETURN 15FT ADLT (MISCELLANEOUS) ×2 IMPLANT
GLOVE BIO SURGEON STRL SZ7.5 (GLOVE) ×2 IMPLANT
GLOVE BIO SURGEON STRL SZ8.5 (GLOVE) ×2 IMPLANT
GLOVE BIOGEL PI IND STRL 8 (GLOVE) ×1 IMPLANT
GLOVE BIOGEL PI IND STRL 9 (GLOVE) ×1 IMPLANT
GLOVE BIOGEL PI INDICATOR 8 (GLOVE) ×1
GLOVE BIOGEL PI INDICATOR 9 (GLOVE) ×1
GOWN STRL REUS W/TWL XL LVL3 (GOWN DISPOSABLE) ×4 IMPLANT
HANDPIECE INTERPULSE COAX TIP (DISPOSABLE) ×2
HOOD PEEL AWAY FLYTE STAYCOOL (MISCELLANEOUS) ×6 IMPLANT
KIT TURNOVER KIT A (KITS) IMPLANT
NDL HYPO 21X1.5 SAFETY (NEEDLE) ×2 IMPLANT
NEEDLE HYPO 21X1.5 SAFETY (NEEDLE) ×4 IMPLANT
NS IRRIG 1000ML POUR BTL (IV SOLUTION) ×2 IMPLANT
PACK TOTAL KNEE CUSTOM (KITS) ×2 IMPLANT
PENCIL SMOKE EVACUATOR (MISCELLANEOUS) IMPLANT
PIN STEINMAN FIXATION KNEE (PIN) ×1 IMPLANT
PLATE REV TIB BAS ROT SZ5 KNEE (Orthopedic Implant) IMPLANT
PROTECTOR NERVE ULNAR (MISCELLANEOUS) ×2 IMPLANT
REV TIB BASE ROT PLAT SZ5 KNEE (Orthopedic Implant) ×2 IMPLANT
SET HNDPC FAN SPRY TIP SCT (DISPOSABLE) ×1 IMPLANT
STAPLER VISISTAT 35W (STAPLE) IMPLANT
STEM KNEE ATTUNE 12X110MM (Stem) ×1 IMPLANT
SUT VIC AB 1 CTX 36 (SUTURE) ×4
SUT VIC AB 1 CTX36XBRD ANBCTR (SUTURE) ×1 IMPLANT
SUT VIC AB 3-0 CT1 27 (SUTURE) ×4
SUT VIC AB 3-0 CT1 TAPERPNT 27 (SUTURE) ×1 IMPLANT
SWAB COLLECTION DEVICE MRSA (MISCELLANEOUS) IMPLANT
SWAB CULTURE ESWAB REG 1ML (MISCELLANEOUS) IMPLANT
SYR CONTROL 10ML LL (SYRINGE) ×4 IMPLANT
TRAY FOLEY MTR SLVR 16FR STAT (SET/KITS/TRAYS/PACK) ×2 IMPLANT
WATER STERILE IRR 1000ML POUR (IV SOLUTION) ×4 IMPLANT
WRAP KNEE MAXI GEL POST OP (GAUZE/BANDAGES/DRESSINGS) ×2 IMPLANT
YANKAUER SUCT BULB TIP 10FT TU (MISCELLANEOUS) ×2 IMPLANT

## 2019-05-23 NOTE — Anesthesia Procedure Notes (Signed)
Anesthesia Regional Block: Adductor canal block   Pre-Anesthetic Checklist: ,, timeout performed, Correct Patient, Correct Site, Correct Laterality, Correct Procedure,, site marked, risks and benefits discussed, Surgical consent,  Pre-op evaluation,  At surgeon's request and post-op pain management  Laterality: Right  Prep: chloraprep       Needles:  Injection technique: Single-shot  Needle Type: Echogenic Stimulator Needle     Needle Length: 9cm  Needle Gauge: 21     Additional Needles:   Procedures:,,,, ultrasound used (permanent image in chart),,,,  Narrative:  Start time: 05/23/2019 11:10 AM End time: 05/23/2019 11:20 AM Injection made incrementally with aspirations every 5 mL.  Performed by: Personally  Anesthesiologist: Leonides Grills, MD  Additional Notes: Functioning IV was confirmed and monitors were applied. A time-out was performed. Hand hygiene and sterile gloves were used. The thigh was placed in a frog-leg position and prepped in a sterile fashion. A 61mm 21ga Arrow echogenic stimulator needle was placed using ultrasound guidance.  Negative aspiration and negative test dose prior to incremental administration of local anesthetic. The patient tolerated the procedure well.

## 2019-05-23 NOTE — Discharge Instructions (Signed)

## 2019-05-23 NOTE — Interval H&P Note (Signed)
History and Physical Interval Note:  05/23/2019 11:10 AM  Allison Mcdaniel  has presented today for surgery, with the diagnosis of LOOSE RIGHT KNEE REPLACETMENT AND TIBAL COMPONENT.  The various methods of treatment have been discussed with the patient and family. After consideration of risks, benefits and other options for treatment, the patient has consented to  Procedure(s): RIGHT TOTAL KNEE REVISION (Right) as a surgical intervention.  The patient's history has been reviewed, patient examined, no change in status, stable for surgery.  I have reviewed the patient's chart and labs.  Questions were answered to the patient's satisfaction.     Nestor Lewandowsky

## 2019-05-23 NOTE — Progress Notes (Signed)
Physical Therapy Treatment Patient Details Name: Allison Mcdaniel MRN: [id number] DOB: 06-Aug-1955 Today's Date: 05/23/2019    History of Present Illness Patient is 64 y.o. female s/p Rt TKR on 05/23/19 with PMH significant for OSA, HTN, HLD, DM, depression, OA, Rt TKA on 02/27/2016.    PT Comments    Patient requesting to return to bed. Assist provided for set up and line management. Pt demonstrates good safety awareness with use of RW. No overt LOB noted during stand step transfer from recliner to bed. Acute PT will continue to follow and progress as able.     Follow Up Recommendations  Follow surgeon's recommendation for DC plan and follow-up therapies     Equipment Recommendations  None recommended by PT    Recommendations for Other Services       Precautions / Restrictions Precautions Precautions: Fall Restrictions Weight Bearing Restrictions: No    Mobility  Bed Mobility Overal bed mobility: Needs Assistance Bed Mobility: Sit to Supine     Supine to sit: HOB elevated;Min guard Sit to supine: HOB elevated;Min guard   General bed mobility comments: pt able to sequence bed mobility without assist or cues  Transfers Overall transfer level: Needs assistance Equipment used: Rolling walker (2 wheeled) Transfers: Sit to/from [employer] Sit to Stand: Min guard Stand pivot transfers: Min guard       General transfer comment: pt with good safety awarenss for hand placement and steady with rising. Pt abel to sequence side steps to move from recliner to EOB. Pt maintained safe position to RW. 1 additional sit<>stand from EOB for repositioning.   Ambulation/Gait Ambulation/Gait assistance: Min assist Gait Distance (Feet): 80 Feet Assistive device: Rolling walker (2 wheeled) Gait Pattern/deviations: Step-to pattern;Decreased stride length;Decreased weight shift to right Gait velocity: fair   General Gait Details: pt wtih good safety awareness and no cues  needed for safe proximity to RW or safe step sequencing.   Stairs             Wheelchair Mobility    Modified Rankin (Stroke Patients Only)       Balance Overall balance assessment: Needs assistance Sitting-balance support: Feet supported Sitting balance-Leahy Scale: Good     Standing balance support: During functional activity;Bilateral upper extremity supported Standing balance-Leahy Scale: Fair         Cognition Arousal/Alertness: Awake/alert Behavior During Therapy: WFL for tasks assessed/performed Overall Cognitive Status: Within Functional Limits for tasks assessed         Exercises Total Joint Exercises Ankle Circles/Pumps: AROM;Both;20 reps;Seated Quad Sets: AROM;5 reps;Right;Seated Heel Slides: AROM;Right;5 reps;Seated    General Comments        Pertinent Vitals/Pain Pain Assessment: Faces Faces Pain Scale: Hurts a little bit Pain Location: Rt knee Pain Descriptors / Indicators: Aching;Discomfort Pain Intervention(s): Limited activity within patient's tolerance;Monitored during session;Repositioned;Ice applied    Home Living Family/patient expects to be discharged to:: Private residence Living Arrangements: Alone Available Help at Discharge: Friend(s) Type of Home: House Home Access: Stairs to enter Entrance Stairs-Rails: Can reach both Home Layout: One level Home Equipment: Environmental consultant - 2 wheels;Cane - single point;Tub bench;Grab bars - toilet;Grab bars - tub/shower      Prior Function Level of Independence: Independent      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   PT Goals (current goals can now be found in the care plan section) Acute Rehab PT Goals Patient Stated Goal: get back to walking in the woods with her dogs PT Goal Formulation: With patient  Time For Goal Achievement: 05/30/19 Potential to Achieve Goals: Good Progress towards PT goals: Progressing toward goals    Frequency    7X/week      PT Plan Current plan remains appropriate        AM-PAC PT "6 Clicks" Mobility   Outcome Measure  Help needed turning from your back to your side while in a flat bed without using bedrails?: None Help needed moving from lying on your back to sitting on the side of a flat bed without using bedrails?: None Help needed moving to and from a bed to a chair (including a wheelchair)?: A Little Help needed standing up from a chair using your arms (e.g., wheelchair or bedside chair)?: A Little Help needed to walk in hospital room?: A Little Help needed climbing 3-5 steps with a railing? : A Little 6 Click Score: 20    End of Session Equipment Utilized During Treatment: Gait belt Activity Tolerance: Patient tolerated treatment well Patient left: in chair;with call bell/phone within reach;with chair alarm set;with nursing/sitter in room;with family/visitor present Nurse Communication: Mobility status PT Visit Diagnosis: Muscle weakness (generalized) (M62.81);Difficulty in walking, not elsewhere classified (R26.2)     Time: 8295-6213 PT Time Calculation (min) (ACUTE ONLY): 14 min  Charges:  $Therapeutic Exercise: 8-22 mins $Therapeutic Activity: 8-22 mins                    Wynn Maudlin, DPT Physical Therapist with [facility] [phone]  05/23/2019 7:03 PM

## 2019-05-23 NOTE — Op Note (Signed)
PATIENT ID:      CIERA VANTINE  MRN:     [id number] DOB/AGE:    September 15, 1955 / 64 y.o.       OPERATIVE REPORT   DATE OF PROCEDURE:  05/23/2019      PREOPERATIVE DIAGNOSIS:   LOOSE RIGHT KNEE REPLACETMENT AND TIBAL COMPONENT      Estimated body mass index is 33.41 kg/m as calculated from the following:   Height as of 05/20/19: 5' 8.5" (1.74 m).   Weight as of 05/20/19: 101.2 kg.                                                       POSTOPERATIVE DIAGNOSIS:  Same                                  PROCEDURE:  Revision R TKA, with removal of loose #6 attune first generation tibial baseplate revision 2 #5 attune revision baseplate with 12 mm x 110 mm stem cement along the baseplate the stem itself was noncemented    SURGEON: Nestor Lewandowsky  ASSISTANT:   Tomi Likens. [employer]   (Present and scrubbed throughout the case, critical for assistance with exposure, retraction, instrumentation, and closure.)        ANESTHESIA: Spinal, 20cc Exparel, 50cc 0.25% Marcaine EBL: 300 cc FLUID REPLACEMENT: 1500 cc crystaloid TOURNIQUET: DRAINS: None TRANEXAMIC ACID: 1gm IV, 2gm topical COMPLICATIONS:  None         INDICATIONS FOR PROCEDURE: S/p primary right total knee arthroplasty in 2018 had some catching popping and pain requiring removal of fibrous bands 10 months later.  Over the last 6 months she developed increasing pain in the knee and recurrent effusions.  Plain radiographs are nondiagnostic but bone scan showed increased uptake along the lateral side of the tibial implant she may have some loosening or micromotion.  Pain is gotten progressively worse.  Aspirate was negative for infection and her inflammatory markers were also negative.  She desires elective exploration of right total knee and probable revision of the loose tibial component.  If any other components are loose they will be revised at the same time.  Risks and benefits of surgery have been discussed, questions answered.   DESCRIPTION OF  PROCEDURE: The patient identified by armband, received  IV antibiotics, in the holding area at [facility]. Patient taken to the operating room, appropriate anesthetic monitors were attached, and spinal anesthesia was  induced. IV Tranexamic acid was given.Tourniquet applied high to the operative thigh. Lateral post and foot positioner applied to the table, the lower extremity was then prepped and draped in usual sterile fashion from the toes to the tourniquet. Time-out procedure was performed. The skin and subcutaneous tissue along the incision was injected with 20 cc of a mixture of Exparel and Marcaine solution, using a 20-gauge by 1-1/2 inch needle. We began the operation, with the knee flexed 130 degrees, by making the anterior midline incision starting at handbreadth above the patella going over the patella 1 cm medial to and 4 cm distal to the tibial tubercle. Small bleeders in the skin and the subcutaneous tissue identified and cauterized. Transverse retinaculum was incised and reflected medially and a medial parapatellar arthrotomy was accomplished.  Fluid was sent  for Gram stain and culture and came back negative.  We performed a synovectomy removing synovium from around the femoral implant allowing [place] to examine interface between the metal bone and cement and there was no gross loosening noted.  We then applied the slaphammer to the femoral implant it sharply 3 or 4 times no motion was detected no bubbling in the interface between the metal cement and bone.  In a similar fashion synovium was removed from around the patellar implant and it was also noted to be stable.  Eventually we were able to evert the patella and hyperflexed the knee allowing [place] to simply remove the attune RP bearing and there was little if any were noted.  We then continued to remove scar tissue from around the tibial implant 1 to 2 mm of overhang was noted posterior medially and this was a good place for [place] to strike it  sharply with a osteotome and hammer and the implant did pop off of the tibia leaving the cement behind.  We then used the entry drill on the proximal tibia and sequentially reamed up to a 12 mm reamer to the appropriate depth for 110 mm stem and partially reamed with a 13 mm reamer.  With a 12 mm reamer in place we then performed a 1 to 2 mm cut of the proximal tibia and then removed the cement that was left behind using quarter inch osteotomes and osteotomes from the [facility] revision set.  We then assembled a proximal conical reamer with a 12 mm trial stem performed our conical ream sized for a #5 attune revision implant assembled a 5 baseplate with 110 mm x 12 mm trial stem inserted in the proximal tibia and performed the delta fin keel punch.  We then performed a trial reduction with 6 7 and 8 mm bearings a 7 mm bearing had the best fit with full extension and good ligamentous tension.  At this point all the trial implants were removed proximal tibia was then [facility] cleaned with pulse lavage suction and sponges.  Exparel was instilled ~into the soft tissues posteriorly medially and laterally.  Once again the bone was dried double batch of HEB cement was then mixed and applied to the MBT baseplate, not to the stem, and to the proximal bone of the tibia the implant was then hammered into place and the appropriate rotation 7 mm bearing was inserted after removing excess cement the knee was reduced and held in 30 degrees of flexion with the foot positioner as the cement cured.  The rest of the Exparel was then injected into the soft tissues. The parapatellar arthrotomy was closed with running #1 Vicryl suture. The subcutaneous tissue with 0 and 2-0 undyed Vicryl suture, and the skin with running 3-0 SQ vicryl. An Aquacil and Ace wrap were applied. The patient was taken to recovery room without difficulty.   Nestor Lewandowsky 05/23/2019, 7:15 AM

## 2019-05-23 NOTE — Anesthesia Postprocedure Evaluation (Signed)
Anesthesia Post Note  Patient: Allison Mcdaniel  Procedure(s) Performed: RIGHT TOTAL KNEE REVISION (Right Knee)     Patient location during evaluation: PACU Anesthesia Type: Regional Level of consciousness: awake and alert Pain management: pain level controlled Vital Signs Assessment: post-procedure vital signs reviewed and stable Respiratory status: spontaneous breathing, nonlabored ventilation and respiratory function stable Cardiovascular status: blood pressure returned to baseline and stable Postop Assessment: no apparent nausea or vomiting Anesthetic complications: no    Last Vitals:  Vitals:   05/23/19 1530 05/23/19 1551  BP: 133/81 (!) 148/82  Pulse: 75 67  Resp: 18   Temp: 36.6 C 37 C  SpO2: 96% 100%    Last Pain:  Vitals:   05/23/19 1530  TempSrc:   PainSc: 0-No pain                 [name]

## 2019-05-23 NOTE — Anesthesia Preprocedure Evaluation (Addendum)
Anesthesia Evaluation  Patient identified by MRN, date of birth, ID band Patient awake    Reviewed: Allergy & Precautions, NPO status , Patient's Chart, lab work & pertinent test results, reviewed documented beta blocker date and time   History of Anesthesia Complications (+) PONV and history of anesthetic complications  Airway Mallampati: II  TM Distance: >3 FB Neck ROM: Full    Dental no notable dental hx.    Pulmonary sleep apnea ,    Pulmonary exam normal breath sounds clear to auscultation       Cardiovascular hypertension, Pt. on medications and Pt. on home beta blockers Normal cardiovascular exam Rhythm:Regular Rate:Normal  ECG: NSR, rate 71   Neuro/Psych PSYCHIATRIC DISORDERS Depression negative neurological ROS     GI/Hepatic negative GI ROS, NAFLD (nonalcoholic fatty liver disease)   Endo/Other  diabetes, Oral Hypoglycemic Agents  Renal/GU negative Renal ROS     Musculoskeletal negative musculoskeletal ROS (+)   Abdominal (+) + obese,   Peds  Hematology HLD   Anesthesia Other Findings LOOSE RIGHT KNEE REPLACETMENT AND TIBAL COMPONENT  Reproductive/Obstetrics                            Anesthesia Physical Anesthesia Plan  ASA: III  Anesthesia Plan: Spinal and Regional   Post-op Pain Management:    Induction: Intravenous  PONV Risk Score and Plan: 3 and Ondansetron, Dexamethasone, Propofol infusion and Treatment may vary due to age or medical condition  Airway Management Planned: Simple Face Mask  Additional Equipment:   Intra-op Plan:   Post-operative Plan:   Informed Consent: I have reviewed the patients History and Physical, chart, labs and discussed the procedure including the risks, benefits and alternatives for the proposed anesthesia with the patient or authorized representative who has indicated his/her understanding and acceptance.     Dental advisory  given  Plan Discussed with: CRNA  Anesthesia Plan Comments:         Anesthesia Quick Evaluation

## 2019-05-23 NOTE — Transfer of Care (Signed)
Immediate Anesthesia Transfer of Care Note  Patient: Allison Mcdaniel  Procedure(s) Performed: RIGHT TOTAL KNEE REVISION (Right Knee)  Patient Location: PACU  Anesthesia Type:Spinal  Level of Consciousness: awake, alert , oriented and patient cooperative  Airway & Oxygen Therapy: Patient Spontanous Breathing and Patient connected to face mask oxygen  Post-op Assessment: Report given to RN and Post -op Vital signs reviewed and stable  Post vital signs: Reviewed and stable  Last Vitals:  Vitals Value Taken Time  BP 131/66 05/23/19 1447  Temp    Pulse 68 05/23/19 1448  Resp 18 05/23/19 1448  SpO2 100 % 05/23/19 1448  Vitals shown include unvalidated device data.  Last Pain:  Vitals:   05/23/19 1120  TempSrc:   PainSc: 0-No pain         Complications: No apparent anesthesia complications

## 2019-05-23 NOTE — Progress Notes (Signed)
Orthopedic Tech Progress Note Patient Details:  Allison Mcdaniel 30-Apr-1955 [id number]  Ortho Devices Type of Ortho Device: Bone foam zero knee Ortho Device/Splint Interventions: Application   Post Interventions Patient Tolerated: Well Instructions Provided: Care of device   Saul Fordyce 05/23/2019, 2:58 PM

## 2019-05-23 NOTE — Anesthesia Procedure Notes (Signed)
Spinal  Patient location during procedure: OR Start time: 05/23/2019 12:23 PM End time: 05/23/2019 12:28 PM Staffing Performed: resident/CRNA  Resident/CRNA: Garth Bigness, CRNA Preanesthetic Checklist Completed: patient identified, IV checked, site marked, risks and benefits discussed, surgical consent, monitors and equipment checked, pre-op evaluation and timeout performed Spinal Block Patient position: sitting Prep: ChloraPrep Patient monitoring: heart rate, cardiac monitor, continuous pulse ox and blood pressure Approach: midline Location: L3-4 Injection technique: single-shot Needle Needle type: Pencan  Needle gauge: 25 G Needle insertion depth: 5 cm

## 2019-05-23 NOTE — Progress Notes (Signed)
AssistedDr. Ellender with right, ultrasound guided, adductor canal block. Side rails up, monitors on throughout procedure. See vital signs in flow sheet. Tolerated Procedure well.  

## 2019-05-23 NOTE — Evaluation (Signed)
Physical Therapy Evaluation Patient Details Name: Allison Mcdaniel MRN: [id number] DOB: 1955/08/31 Today's Date: 05/23/2019   History of Present Illness  Patient is 64 y.o. female s/p Rt TKR on 05/23/19 with PMH significant for OSA, HTN, HLD, DM, depression, OA, Rt TKA on 02/27/2016.    Clinical Impression  LYSETTE KINYON is a 64 y.o. female POD 0 s/p Rt TKR. Patient reports independence with mobility at baseline. Patient is now limited by functional impairments (see PT problem list below) and requires min guard/assist for transfers and gait with RW. Patient was able to ambulate ~80 feet with RW and min guard assist. Patient instructed in exercise to facilitate ROM and circulation. Patient will benefit from continued skilled PT interventions to address impairments and progress towards PLOF. Acute PT will follow to progress mobility and stair training in preparation for safe discharge home.     Follow Up Recommendations Follow surgeon's recommendation for DC plan and follow-up therapies    Equipment Recommendations  None recommended by PT    Recommendations for Other Services       Precautions / Restrictions Precautions Precautions: Fall Restrictions Weight Bearing Restrictions: No      Mobility  Bed Mobility Overal bed mobility: Needs Assistance Bed Mobility: Supine to Sit     Supine to sit: HOB elevated;Min guard     General bed mobility comments: pt able to sequence bed mobility without assist or cues  Transfers Overall transfer level: Needs assistance Equipment used: Rolling walker (2 wheeled) Transfers: Sit to/from Stand Sit to Stand: Min guard;From elevated surface         General transfer comment: cues for hand placement but pt prefers to use bil UE's to push up from EOB. p required elevated surace. very light assist to rise.   Ambulation/Gait Ambulation/Gait assistance: Min assist Gait Distance (Feet): 80 Feet Assistive device: Rolling walker (2 wheeled) Gait  Pattern/deviations: Step-to pattern;Decreased stride length;Decreased weight shift to right Gait velocity: fair   General Gait Details: pt wtih good safety awareness and no cues needed for safe proximity to RW or safe step sequencing.  Stairs            Wheelchair Mobility    Modified Rankin (Stroke Patients Only)       Balance Overall balance assessment: Needs assistance Sitting-balance support: Feet supported Sitting balance-Leahy Scale: Good     Standing balance support: During functional activity;Bilateral upper extremity supported Standing balance-Leahy Scale: Fair          Pertinent Vitals/Pain Pain Assessment: Faces Faces Pain Scale: Hurts a little bit Pain Location: Rt knee Pain Descriptors / Indicators: Aching;Discomfort Pain Intervention(s): Limited activity within patient's tolerance;Monitored during session;Repositioned;Ice applied    Home Living Family/patient expects to be discharged to:: Private residence Living Arrangements: Alone Available Help at Discharge: Friend(s) Type of Home: House Home Access: Stairs to enter Entrance Stairs-Rails: Can reach both Entrance Stairs-Number of Steps: 3+1 Home Layout: One level Home Equipment: Environmental consultant - 2 wheels;Cane - single point;Tub bench;Grab bars - toilet;Grab bars - tub/shower      Prior Function Level of Independence: Independent         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 RLE Sensation: WNL RLE  Coordination: WNL    Cervical / Trunk Assessment Cervical / Trunk Assessment: Normal  Communication   Communication: No difficulties  Cognition Arousal/Alertness: Awake/alert Behavior During  Therapy: WFL for tasks assessed/performed Overall Cognitive Status: Within Functional Limits for tasks assessed           General Comments      Exercises Total Joint Exercises Ankle Circles/Pumps: AROM;Both;20 reps;Seated Quad Sets: AROM;5 reps;Right;Seated Heel Slides: AROM;Right;5 reps;Seated   Assessment/Plan    PT Assessment Patient needs continued PT services  PT Problem List Decreased strength;Decreased range of motion;Decreased activity tolerance;Decreased balance;Decreased mobility;Decreased knowledge of use of DME;Obesity       PT Treatment Interventions DME instruction;Gait training;Stair training;Functional mobility training;Therapeutic activities;Therapeutic exercise;Balance training;Patient/family education    PT Goals (Current goals can be found in the Care Plan section)  Acute Rehab PT Goals Patient Stated Goal: get back to walking in the woods with her dogs PT Goal Formulation: With patient Time For Goal Achievement: 05/30/19 Potential to Achieve Goals: Good    Frequency 7X/week    AM-PAC PT "6 Clicks" Mobility  Outcome Measure Help needed turning from your back to your side while in a flat bed without using bedrails?: None Help needed moving from lying on your back to sitting on the side of a flat bed without using bedrails?: None Help needed moving to and from a bed to a chair (including a wheelchair)?: A Little Help needed standing up from a chair using your arms (e.g., wheelchair or bedside chair)?: A Little Help needed to walk in hospital room?: A Little Help needed climbing 3-5 steps with a railing? : A Little 6 Click Score: 20    End of Session Equipment Utilized During Treatment: Gait belt Activity Tolerance: Patient tolerated treatment well Patient left: in chair;with call bell/phone within reach;with chair alarm set;with nursing/sitter in room;with family/visitor present Nurse Communication: Mobility status PT Visit Diagnosis: Muscle weakness  (generalized) (M62.81);Difficulty in walking, not elsewhere classified (R26.2)    Time: 5621-3086 PT Time Calculation (min) (ACUTE ONLY): 24 min   Charges:   PT Evaluation $PT Eval Low Complexity: 1 Low PT Treatments $Therapeutic Exercise: 8-22 mins        Wynn Maudlin, DPT Physical Therapist with [facility] [phone]  05/23/2019 5:29 PM

## 2019-05-24 ENCOUNTER — Encounter: Payer: Self-pay | Admitting: *Deleted

## 2019-05-24 DIAGNOSIS — T8484XA Pain due to internal orthopedic prosthetic devices, implants and grafts, initial encounter: Secondary | ICD-10-CM | POA: Diagnosis not present

## 2019-05-24 LAB — CBC
HCT: 36.1 % (ref 36.0–46.0)
Hemoglobin: 11.8 g/dL — ABNORMAL LOW (ref 12.0–15.0)
MCH: 30.3 pg (ref 26.0–34.0)
MCHC: 32.7 g/dL (ref 30.0–36.0)
MCV: 92.8 fL (ref 80.0–100.0)
Platelets: 244 10*3/uL (ref 150–400)
RBC: 3.89 MIL/uL (ref 3.87–5.11)
RDW: 13 % (ref 11.5–15.5)
WBC: 8.5 10*3/uL (ref 4.0–10.5)
nRBC: 0 % (ref 0.0–0.2)

## 2019-05-24 LAB — BASIC METABOLIC PANEL
Anion gap: 8 (ref 5–15)
BUN: 14 mg/dL (ref 8–23)
CO2: 24 mmol/L (ref 22–32)
Calcium: 8.9 mg/dL (ref 8.9–10.3)
Chloride: 105 mmol/L (ref 98–111)
Creatinine, Ser: 0.95 mg/dL (ref 0.44–1.00)
GFR calc Af Amer: >60 mL/min (ref >60)
GFR calc non Af Amer: >60 mL/min (ref >60)
Glucose, Bld: 196 mg/dL — ABNORMAL HIGH (ref 70–99)
Potassium: 4 mmol/L (ref 3.5–5.1)
Sodium: 137 mmol/L (ref 135–145)

## 2019-05-24 LAB — GLUCOSE, CAPILLARY: Glucose-Capillary: 199 mg/dL — ABNORMAL HIGH (ref 70–99)

## 2019-05-24 NOTE — Discharge Summary (Signed)
Patient ID: Allison Mcdaniel MRN: [id number] DOB/AGE: 66-Oct-1957 66 y.o.  Admit date: 05/23/2019 Discharge date: 05/24/2019  Admission Diagnoses:  Principal Problem:   Painful total knee replacement, right (HCC) Active Problems:   S/P revision of total knee, right   Discharge Diagnoses:  Same  Past Medical History:  Diagnosis Date  . Abnormal uterine bleeding   . Amenorrhea   . Arthritis   . ASCUS (atypical squamous cells of undetermined significance) on Pap smear   . Atrophic endometrium   . Blood transfusion without reported diagnosis   . Colon polyp   . Depression   . Diabetes mellitus    type 2  . Diverticulosis   . Galactorrhea    history very remote in her 68s  . Hyperlipidemia   . Hypertension   . NAFLD (nonalcoholic fatty liver disease)   . OSA on CPAP    cpap  . Over weight   . PONV (postoperative nausea and vomiting)     Surgeries: Procedure(s): RIGHT TOTAL KNEE REVISION on 05/23/2019   Consultants:   Discharged Condition: Improved  Hospital Course: Allison Mcdaniel is an 64 y.o. female who was admitted 05/23/2019 for operative treatment ofPainful total knee replacement, right (HCC). Patient has severe unremitting pain that affects sleep, daily activities, and work/hobbies. After pre-op clearance the patient was taken to the operating room on 05/23/2019 and underwent  Procedure(s): RIGHT TOTAL KNEE REVISION.    Patient was given perioperative antibiotics:  Anti-infectives (From admission, onward)   Start     Dose/Rate Route Frequency Ordered Stop   05/23/19 0600  vancomycin (VANCOREADY) IVPB 1500 mg/300 mL     1,500 mg 150 mL/hr over 120 Minutes Intravenous On call to O.R. 05/22/19 0753 05/23/19 1338       Patient was given sequential compression devices, early ambulation, and chemoprophylaxis to prevent DVT.  Patient benefited maximally from hospital stay and there were no complications.    Recent vital signs:  Patient Vitals for the past 24 hrs:  BP  Temp Temp src Pulse Resp SpO2 Weight  05/24/19 0710 137/72 97.7 F (36.5 C) Oral 71 18 98 % no documentation  05/23/19 2201 (Abnormal) 143/83 97.8 F (36.6 C) Oral 73 16 95 % no documentation  05/23/19 1953 no documentation no documentation no documentation 78 18 95 % no documentation  05/23/19 1715 (Abnormal) 149/82 98.3 F (36.8 C) no documentation 76 18 100 % no documentation  05/23/19 1645 (Abnormal) 142/80 98.4 F (36.9 C) Oral 72 16 100 % no documentation  05/23/19 1551 (Abnormal) 148/82 98.6 F (37 C) no documentation 67 no documentation 100 % no documentation  05/23/19 1530 133/81 97.8 F (36.6 C) no documentation 75 18 96 % no documentation  05/23/19 1515 126/79 no documentation no documentation 66 19 96 % no documentation  05/23/19 1500 (Abnormal) 108/96 no documentation no documentation 70 17 99 % no documentation  05/23/19 1447 131/66 97.9 F (36.6 C) no documentation 68 18 100 % no documentation  05/23/19 1145 122/72 no documentation no documentation 66 17 99 % no documentation  05/23/19 1130 no documentation no documentation no documentation 65 17 97 % no documentation  05/23/19 1129 no documentation no documentation no documentation 66 20 97 % no documentation  05/23/19 1128 no documentation no documentation no documentation 66 19 98 % no documentation  05/23/19 1127 no documentation no documentation no documentation 64 19 99 % no documentation  05/23/19 1126 no documentation no documentation no documentation 65 (Abnormal) 21 100 %  no documentation  05/23/19 1125 133/76 no documentation no documentation 67 18 99 % no documentation  05/23/19 1124 no documentation no documentation no documentation 66 20 98 % no documentation  05/23/19 1123 no documentation no documentation no documentation 66 20 99 % no documentation  05/23/19 1122 no documentation no documentation no documentation 68 19 98 % no documentation  05/23/19 1121 no documentation no documentation no documentation  68 20 100 % no documentation  05/23/19 1120 140/74 no documentation no documentation 67 (Abnormal) 21 98 % no documentation  05/23/19 1119 no documentation no documentation no documentation 65 20 100 % no documentation  05/23/19 1118 no documentation no documentation no documentation 68 19 98 % no documentation  05/23/19 1117 no documentation no documentation no documentation 66 (Abnormal) 21 99 % no documentation  05/23/19 1116 no documentation no documentation no documentation 67 (Abnormal) 23 100 % no documentation  05/23/19 1115 (Abnormal) 145/78 no documentation no documentation 69 16 100 % no documentation  05/23/19 1114 no documentation no documentation no documentation 67 14 100 % no documentation  05/23/19 1113 no documentation no documentation no documentation 67 (Abnormal) 22 100 % no documentation  05/23/19 1112 no documentation no documentation no documentation 68 19 99 % no documentation  05/23/19 1111 (Abnormal) 150/79 no documentation no documentation no documentation 17 no documentation no documentation  05/23/19 1044 (Abnormal) 156/84 98.7 F (37.1 C) Oral 72 18 96 % no documentation  05/23/19 1031 no documentation no documentation no documentation no documentation no documentation no documentation 101.2 kg     Recent laboratory studies:  Recent Labs    05/24/19 0238  WBC 8.5  HGB 11.8*  HCT 36.1  PLT 244  NA 137  K 4.0  CL 105  CO2 24  BUN 14  CREATININE 0.95  GLUCOSE 196*  CALCIUM 8.9     Discharge Medications:   Allergies as of 05/24/2019    Allergen Reactions Comment   Bextra [valdecoxib] Hives    Penicillins Hives, Itching About 10-15 years       Medication List    Take these medications   aspirin EC 81 MG tablet Take 1 tablet (81 mg total) by mouth 2 (two) times daily. What changed: when to take this   B-12 (Methylcobalamin) 1000 MCG Subl Take 1,000 mcg by mouth daily.   BERBERINE COMPLEX PO Take 500 mg by mouth at bedtime.   Biotin 10272  MCG Tabs Take 10,000 mcg by mouth daily.   CALCIUM & VIT D3 BONE HEALTH PO Take 1 tablet by mouth in the morning and at bedtime.   canagliflozin 100 MG Tabs tablet Commonly known as: Invokana Take 1 tablet (100 mg total) by mouth daily before breakfast.   cetirizine 10 MG tablet Commonly known as: ZYRTEC Take 10 mg by mouth daily as needed for allergies.   CLA PO Take 1,000 mg by mouth in the morning and at bedtime.   clindamycin 300 MG capsule Commonly known as: CLEOCIN Take 600 mg by mouth See admin instructions. Take 2 capsules (600 mg) by mouth 1 hour prior to dental appointments.   CRANBERRY PLUS VITAMIN C PO Take 1 capsule by mouth 2 (two) times daily. CranActin Cranberry AF Extract   fenofibrate 145 MG tablet Commonly known as: TRICOR TAKE 1 TABLET BY MOUTH EVERY DAY   FOLATE PO Take 4,000 mcg by mouth daily.   glucose blood test strip Use as instructed   lisinopril-hydrochlorothiazide 10-12.5 MG tablet Commonly known as: ZESTORETIC TAKE 1  TABLET BY MOUTH DAILY.   metFORMIN 500 MG 24 hr tablet Commonly known as: GLUCOPHAGE-XR Take 2 tablets (1,000 mg total) by mouth 2 (two) times daily. PATIENT NEEDS OFFICE VISIT FOR ADDITIONAL REFILLS   metoprolol tartrate 25 MG tablet Commonly known as: LOPRESSOR TAKE 1 TABLET (25 MG TOTAL) BY MOUTH 2 (TWO) TIMES DAILY.   Milk Thistle 150 MG Caps Take 150 mg by mouth in the morning and at bedtime.   NON FORMULARY Take 2 capsules by mouth in the morning and at bedtime. Glucose Support Formula   OMEGA 3 PO Take 1,600 mg by mouth in the morning and at bedtime.   oxyCODONE-acetaminophen 5-325 MG tablet Commonly known as: PERCOCET/ROXICET Take 1 tablet by mouth every 4 (four) hours as needed for severe pain.   Ozempic (1 MG/DOSE) 2 MG/1.5ML Sopn Generic drug: Semaglutide (1 MG/DOSE) INJECT 1 MG INTO THE SKIN ONCE A WEEK.   repaglinide 2 MG tablet Commonly known as: PRANDIN Take 1 tablet (2 mg total) by mouth 3  (three) times daily before meals.   simvastatin 20 MG tablet Commonly known as: ZOCOR Take 20 mg by mouth at bedtime.   SYSTANE BALANCE OP Place 1 drop into both eyes 2 (two) times daily.   tiZANidine 2 MG tablet Commonly known as: ZANAFLEX Take 1 tablet (2 mg total) by mouth every 6 (six) hours as needed.   Ubiquinol 100 MG Caps Take 100 mg by mouth daily.   Vitamin D3 125 MCG (5000 UT) Tabs Take 5,000 Units by mouth daily.   vitamin E 180 MG (400 UNITS) capsule Take 400 Units by mouth 2 (two) times daily.   XLEAR SINUS CARE SPRAY NA Place 1-2 sprays into the nose 2 (two) times daily as needed (allergies/congestion.).        Durable Medical Equipment  (From admission, onward)         Start     Ordered   05/23/19 1557  DME Walker rolling  Once    Question:  Patient needs a walker to treat with the following condition  Answer:  Status post right knee replacement   05/23/19 1557   05/23/19 1557  DME 3 n 1  Once     05/23/19 1557           Discharge Care Instructions  (From admission, onward)         Start     Ordered   05/24/19 0000  Change dressing    Comments: Change dressing Only if drainage exceeds 40% of window on dressing   05/24/19 0806          Diagnostic Studies: DG Chest 2 View  Result Date: 05/17/2019 CLINICAL DATA:  Hypertension, diabetes EXAM: CHEST - 2 VIEW COMPARISON:  02/19/2016 FINDINGS: The heart size and mediastinal contours are within normal limits. No focal airspace consolidation, pleural effusion, or pneumothorax. The visualized skeletal structures are unremarkable. IMPRESSION: No active cardiopulmonary disease. Electronically Signed   By: Duanne Guess D.O.   On: 05/17/2019 15:11    Disposition: Discharge disposition: 01-Home or Self Care       Discharge Instructions    Call MD / Call 911   Complete by: As directed    If you experience chest pain or shortness of breath, CALL 911 and be transported to the hospital  emergency room.  If you develope a fever above 101 F, pus (white drainage) or increased drainage or redness at the wound, or calf pain, call your surgeon's office.  Change dressing   Complete by: As directed    Change dressing Only if drainage exceeds 40% of window on dressing   Constipation Prevention   Complete by: As directed    Drink plenty of fluids.  Prune juice may be helpful.  You may use a stool softener, such as Colace (over the counter) 100 mg twice a day.  Use MiraLax (over the counter) for constipation as needed.   Diet - low sodium heart healthy   Complete by: As directed    Increase activity slowly as tolerated   Complete by: As directed       Follow-up Information    Gean Birchwood, MD In 2 weeks.   Specialty: Orthopedic Surgery Contact information: [address] [place] [address] [phone]            Signed: Nestor Lewandowsky 05/24/2019, 8:07 AM

## 2019-05-24 NOTE — TOC Progression Note (Signed)
Transition of Care [facility]) - Progression Note    Patient Details  Name: Allison Mcdaniel MRN: [id number] Date of Birth: Nov 07, 1955  Transition of Care [facility]) CM/SW Contact  Clearance Coots, LCSW Phone Number: 05/24/2019, 10:15 AM  Clinical Narrative:    Therapy Plan: HHPT-[facility] Patient has a RW and 3 in 1.    Barriers to Discharge: No Barriers Identified  Expected Discharge Plan and Services           Expected Discharge Date: 05/24/19               DME Arranged: N/A DME Agency: NA       HH Arranged: PT HH Agency: [facility] (formerly [facility]) Date HH Agency Contacted: 05/24/19 Time HH Agency Contacted: 1015 Representative spoke with at [facility] Agency: Kathlene November   Social Determinants of Health (SDOH) Interventions    Readmission Risk Interventions No flowsheet data found.

## 2019-05-24 NOTE — Progress Notes (Signed)
Physical Therapy Treatment Patient Details Name: Allison Mcdaniel MRN: [id number] DOB: 09-07-1955 Today's Date: 05/24/2019    History of Present Illness Patient is 64 y.o. female s/p Rt TKR on 05/23/19 with PMH significant for OSA, HTN, HLD, DM, depression, OA, Rt TKA on 02/27/2016.    PT Comments    Pt progressing very well, improving wt shift to RLE, excellent safety awareness. Pt is familiar with stair technique and  TKA HEP. Ready for d/c from PT standpoint  Follow Up Recommendations  Follow surgeon's recommendation for DC plan and follow-up therapies     Equipment Recommendations  None recommended by PT    Recommendations for Other Services       Precautions / Restrictions Precautions Precautions: Fall Restrictions Weight Bearing Restrictions: No    Mobility  Bed Mobility               General bed mobility comments: in recliner on PT arrival  Transfers Overall transfer level: Needs assistance Equipment used: Rolling walker (2 wheeled) Transfers: Sit to/from Stand Sit to Stand: Modified independent (Device/Increase time)         General transfer comment: pt uses correct and safe hand placement for transitions  Ambulation/Gait Ambulation/Gait assistance: Supervision Gait Distance (Feet): 160 Feet Assistive device: Rolling walker (2 wheeled) Gait Pattern/deviations: Step-to pattern;Decreased weight shift to right     General Gait Details: pt demo's good  safety awareness with step-to  gait, improving wt shift to RLE    Stairs             Wheelchair Mobility    Modified Rankin (Stroke Patients Only)       Balance           Standing balance support: No upper extremity supported Standing balance-Leahy Scale: Fair                              Cognition Arousal/Alertness: Awake/alert Behavior During Therapy: WFL for tasks assessed/performed Overall Cognitive Status: Within Functional Limits for tasks assessed                                         Exercises Total Joint Exercises Quad Sets: AROM;5 reps;Right;Seated    General Comments        Pertinent Vitals/Pain Pain Assessment: Faces Faces Pain Scale: Hurts a little bit Pain Location: Rt knee Pain Descriptors / Indicators: Discomfort;Sore    Home Living                      Prior Function            PT Goals (current goals can now be found in the care plan section) Acute Rehab PT Goals Patient Stated Goal: get back to walking in the woods with her dogs PT Goal Formulation: With patient Time For Goal Achievement: 05/30/19 Potential to Achieve Goals: Good Progress towards PT goals: Progressing toward goals    Frequency    7X/week      PT Plan Current plan remains appropriate    Co-evaluation              AM-PAC PT "6 Clicks" Mobility   Outcome Measure  Help needed turning from your back to your side while in a flat bed without using bedrails?: None Help needed moving from lying on your back to sitting  on the side of a flat bed without using bedrails?: None Help needed moving to and from a bed to a chair (including a wheelchair)?: A Little Help needed standing up from a chair using your arms (e.g., wheelchair or bedside chair)?: A Little Help needed to walk in hospital room?: A Little Help needed climbing 3-5 steps with a railing? : A Little 6 Click Score: 20    End of Session Equipment Utilized During Treatment: Gait belt Activity Tolerance: Patient tolerated treatment well Patient left: in chair;with call bell/phone within reach Nurse Communication: Mobility status PT Visit Diagnosis: Muscle weakness (generalized) (M62.81);Difficulty in walking, not elsewhere classified (R26.2)     Time: 1610-9604 PT Time Calculation (min) (ACUTE ONLY): 20 min  Charges:  $Gait Training: 8-22 mins                     Delice Bison, PT   Acute Rehab Dept [facility]):  [phone]   05/24/2019    [facility] 05/24/2019, 10:01 AM

## 2019-05-24 NOTE — Progress Notes (Addendum)
Called MD office after discussing blood clot history with patient.   Patient reported history of blood clots at age 64, and age 61 after a knee scope. Additionally, she reports in the past that she has been on Xarelto after surgery.   She did not want to wait to be discharged. Therefore, when I called the office I requested that Dr. Wadie Lessen medical team call the patient at her personal phone number to inform her about changes to her blood thinner regimen.  . . . Additionally called patient to follow up, and inform her that I called Dr. Wadie Lessen office.  Heron Sabins, RN

## 2019-05-24 NOTE — Progress Notes (Signed)
PATIENT ID: Allison Mcdaniel  MRN: [id number]  DOB/AGE:  Jan 10, 1956 / 64 y.o.  1 Day Post-Op Procedure(s) (LRB): RIGHT TOTAL KNEE REVISION (Right)    PROGRESS NOTE Subjective: Patient is alert, oriented, no Nausea, no Vomiting, yes passing gas. Taking PO well. Denies SOB, Chest or Calf Pain. Using Incentive Spirometer, PAS in place. Ambulate 80', Patient reports pain as 2/10 .    Objective: Vital signs in last 24 hours: Vitals:   05/23/19 1715 05/23/19 1953 05/23/19 2201 05/24/19 0710  BP: (Abnormal) 149/82  (Abnormal) 143/83 137/72  Pulse: 76 78 73 71  Resp: 18 18 16 18   Temp: 98.3 F (36.8 C)  97.8 F (36.6 C) 97.7 F (36.5 C)  TempSrc:   Oral Oral  SpO2: 100% 95% 95% 98%  Weight:          Intake/Output from previous day: I/O last 3 completed shifts: In: 3594.2 [P.O.:540; I.V.:2954.2; IV Piggyback:100] Out: 5660 [Urine:5360; Blood:300]   Intake/Output this shift: No intake/output data recorded.   LABORATORY DATA: Recent Labs    05/23/19 1639 05/23/19 2219 05/24/19 0238 05/24/19 0800  WBC  --   --  8.5  --   HGB  --   --  11.8*  --   HCT  --   --  36.1  --   PLT  --   --  244  --   NA  --   --  137  --   K  --   --  4.0  --   CL  --   --  105  --   CO2  --   --  24  --   BUN  --   --  14  --   CREATININE  --   --  0.95  --   GLUCOSE  --   --  196*  --   GLUCAP 193* 237*  --  199*  CALCIUM  --   --  8.9  --     Examination: Neurologically intact ABD soft Neurovascular intact Sensation intact distally Intact pulses distally Dorsiflexion/Plantar flexion intact Incision: dressing C/D/I No cellulitis present Compartment soft} ROM 0-90, good SLR  Assessment:   1 Day Post-Op Procedure(s) (LRB): RIGHT TOTAL KNEE REVISION (Right) ADDITIONAL DIAGNOSIS: Expected Acute Blood Loss Anemia, Diabetes and Sleep Apnea   Patient's anticipated LOS is less than 2 midnights, meeting these requirements: - Younger than 12 - Lives within 1 hour of care - Has a  competent adult at home to recover with post-op recover - NO history of  - Chronic pain requiring opiods  - Diabetes  - Coronary Artery Disease  - Heart failure  - Heart attack  - Stroke  - DVT/VTE  - Cardiac arrhythmia  - Respiratory Failure/COPD  - Renal failure  - Anemia  - Advanced Liver disease       Plan: PT/OT WBAT, AROM and PROM  DVT Prophylaxis:  SCDx72hrs, ASA 81 mg BID x 2 weeks DISCHARGE PLAN: Home, today after PT DISCHARGE NEEDS: HHPT, Walker and 3-in-1 comode seat     76 05/24/2019, 8:04 AM Patient ID: 05/26/2019, female   DOB: 05/21/1955, 64 y.o.   MRN: [id number]

## 2019-05-24 NOTE — Progress Notes (Signed)
During this shift, patient has been offered twice to put the bone foam on. Patient has refused both times. Patient has been educated about the importance of the bone foam and still refused.

## 2019-05-24 NOTE — Progress Notes (Signed)
RN reviewed discharge instructions with patient.   All questions answered. Patient has DME.  Paperwork given.   Prescriptions sent to pharmacy.     NT rolled patient down with all belongings to family car.    [place], Charity fundraiser

## 2019-05-28 LAB — AEROBIC/ANAEROBIC CULTURE W GRAM STAIN (SURGICAL/DEEP WOUND)
Culture: NO GROWTH
Gram Stain: NONE SEEN

## 2019-06-21 LAB — FUNGUS CULTURE WITH STAIN

## 2019-06-21 LAB — FUNGUS CULTURE RESULT

## 2019-06-21 LAB — FUNGAL ORGANISM REFLEX

## 2019-06-29 ENCOUNTER — Other Ambulatory Visit: Payer: Self-pay

## 2019-06-29 ENCOUNTER — Ambulatory Visit (INDEPENDENT_AMBULATORY_CARE_PROVIDER_SITE_OTHER): Payer: Medicare Other | Admitting: Endocrinology

## 2019-06-29 ENCOUNTER — Encounter: Payer: Self-pay | Admitting: Endocrinology

## 2019-06-29 VITALS — BP 120/70 | HR 80 | Ht 68.5 in | Wt 223.0 lb

## 2019-06-29 DIAGNOSIS — E1142 Type 2 diabetes mellitus with diabetic polyneuropathy: Secondary | ICD-10-CM

## 2019-06-29 MED ORDER — METFORMIN HCL ER 500 MG PO TB24: 1000.0000 mg | ORAL_TABLET | Freq: Two times a day (BID) | ORAL | 3 refills | Status: DC

## 2019-06-29 MED ORDER — CANAGLIFLOZIN 300 MG PO TABS
300.0000 mg | ORAL_TABLET | Freq: Every day | ORAL | 3 refills | Status: DC
Start: 2019-06-29 — End: 2019-08-08

## 2019-06-29 NOTE — Patient Instructions (Addendum)
I have sent a prescription to your pharmacy, to increase the Invokana, and: Please continue the same other diabetes medications.   check your blood sugar once a day.  vary the time of day when you check, between before the 3 meals, and at bedtime.  also check if you have symptoms of your blood sugar being too high or too low.  please keep a record of the readings and bring it to your next appointment here (or you can bring the meter itself).  You can write it on any piece of paper.  please call us sooner if your blood sugar goes below 70, or if you have a lot of readings over 200. Please come back for a follow-up appointment in 2-3 months.
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
## 2019-07-10 ENCOUNTER — Other Ambulatory Visit: Payer: Self-pay | Admitting: Endocrinology

## 2019-08-08 ENCOUNTER — Other Ambulatory Visit: Payer: Self-pay

## 2019-08-08 ENCOUNTER — Telehealth: Payer: Self-pay | Admitting: Endocrinology

## 2019-08-08 MED ORDER — CANAGLIFLOZIN 300 MG PO TABS: 300.0000 mg | ORAL_TABLET | Freq: Every day | ORAL | 3 refills | Status: DC

## 2019-08-08 MED ORDER — OZEMPIC (1 MG/DOSE) 2 MG/1.5ML ~~LOC~~ SOPN: PEN_INJECTOR | SUBCUTANEOUS | 5 refills | Status: DC

## 2019-08-08 NOTE — Telephone Encounter (Signed)
Rx for Ozempic and Invokana has been sent in 30 day supply.

## 2019-08-08 NOTE — Telephone Encounter (Signed)
Patient called re: Patient is in the donut hole and in order for patient to be able to afford Invokana and Ozempic patient requests that a new RX for each of the above listed medications be written for 1 month supply at a time and that the new RX's be sent to:   [id number] - [place], [place] - [address] AT [place] [address] Phone:  [phone]  Fax:  [phone]

## 2019-10-05 ENCOUNTER — Other Ambulatory Visit: Payer: Self-pay

## 2019-10-05 ENCOUNTER — Encounter: Payer: Self-pay | Admitting: Endocrinology

## 2019-10-05 ENCOUNTER — Ambulatory Visit (INDEPENDENT_AMBULATORY_CARE_PROVIDER_SITE_OTHER): Payer: Medicare Other | Admitting: Endocrinology

## 2019-10-05 VITALS — BP 126/80 | HR 73 | Ht 68.5 in | Wt 219.4 lb

## 2019-10-05 DIAGNOSIS — E1142 Type 2 diabetes mellitus with diabetic polyneuropathy: Secondary | ICD-10-CM | POA: Diagnosis not present

## 2019-10-05 LAB — POCT GLYCOSYLATED HEMOGLOBIN (HGB A1C): Hemoglobin A1C: 6.7 % — AB (ref 4.0–5.6)
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
## 2019-10-05 NOTE — Patient Instructions (Signed)
Please continue the same diabetes medications.  check your blood sugar once a day.  vary the time of day when you check, between before the 3 meals, and at bedtime.  also check if you have symptoms of your blood sugar being too high or too low.  please keep a record of the readings and bring it to your next appointment here (or you can bring the meter itself).  You can write it on any piece of paper.  please call us sooner if your blood sugar goes below 70, or if you have a lot of readings over 200.   Please come back for a follow-up appointment in 2-3 months.  

## 2019-11-25 LAB — HM DIABETES EYE EXAM

## 2020-01-18 ENCOUNTER — Other Ambulatory Visit: Payer: Self-pay | Admitting: Endocrinology

## 2020-01-19 ENCOUNTER — Other Ambulatory Visit: Payer: Self-pay | Admitting: Endocrinology

## 2020-01-19 MED ORDER — CANAGLIFLOZIN 300 MG PO TABS: 300.0000 mg | ORAL_TABLET | Freq: Every day | ORAL | 1 refills | Status: DC

## 2020-01-19 MED ORDER — REPAGLINIDE 2 MG PO TABS: 2.0000 mg | ORAL_TABLET | Freq: Three times a day (TID) | ORAL | 1 refills | Status: DC

## 2020-01-19 MED ORDER — METFORMIN HCL ER 500 MG PO TB24: 1000.0000 mg | ORAL_TABLET | Freq: Two times a day (BID) | ORAL | 1 refills | Status: DC

## 2020-01-19 MED ORDER — OZEMPIC (1 MG/DOSE) 4 MG/3ML ~~LOC~~ SOPN: PEN_INJECTOR | SUBCUTANEOUS | 1 refills | Status: DC

## 2020-01-19 NOTE — Addendum Note (Signed)
Addended by: Tawnya Crook on: 01/19/2020 12:51 PM   Modules accepted: Orders

## 2020-01-19 NOTE — Telephone Encounter (Signed)
Switch to mail order

## 2020-02-01 ENCOUNTER — Ambulatory Visit (INDEPENDENT_AMBULATORY_CARE_PROVIDER_SITE_OTHER): Payer: Medicare Other | Admitting: Endocrinology

## 2020-02-01 ENCOUNTER — Encounter: Payer: Self-pay | Admitting: Endocrinology

## 2020-02-01 ENCOUNTER — Other Ambulatory Visit: Payer: Self-pay

## 2020-02-01 VITALS — BP 138/82 | HR 63 | Ht 68.5 in | Wt 221.0 lb

## 2020-02-01 DIAGNOSIS — E1142 Type 2 diabetes mellitus with diabetic polyneuropathy: Secondary | ICD-10-CM | POA: Diagnosis not present
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
## 2020-02-01 NOTE — Patient Instructions (Addendum)
Please continue the same diabetes medications.   Drinking plenty of fluids helps the Invokana work better.   Keep the ulcer covered with antibiotic ointment and large bandaids, until it heals.  Please call if it gets worse.  check your blood sugar once a day.  vary the time of day when you check, between before the 3 meals, and at bedtime.  also check if you have symptoms of your blood sugar being too high or too low.  please keep a record of the readings and bring it to your next appointment here (or you can bring the meter itself).  You can write it on any piece of paper.  please call us sooner if your blood sugar goes below 70, or if you have a lot of readings over 200.   Please come back for a follow-up appointment in 3-4 months.

## 2020-04-25 ENCOUNTER — Telehealth: Payer: Self-pay

## 2020-04-25 NOTE — Telephone Encounter (Signed)
PA for Ozempic has been approved and pt is aware.

## 2020-04-26 ENCOUNTER — Telehealth: Payer: Self-pay

## 2020-04-26 NOTE — Telephone Encounter (Signed)
PA has been approved for Ozempic from 03/26/2020-04/25/2021

## 2020-05-31 ENCOUNTER — Other Ambulatory Visit: Payer: Self-pay

## 2020-05-31 ENCOUNTER — Ambulatory Visit (INDEPENDENT_AMBULATORY_CARE_PROVIDER_SITE_OTHER): Payer: Medicare Other | Admitting: Endocrinology

## 2020-05-31 ENCOUNTER — Telehealth: Payer: Self-pay | Admitting: Endocrinology

## 2020-05-31 VITALS — BP 120/78 | HR 76 | Ht 68.5 in | Wt 221.2 lb

## 2020-05-31 DIAGNOSIS — E1142 Type 2 diabetes mellitus with diabetic polyneuropathy: Secondary | ICD-10-CM

## 2020-05-31 LAB — POCT GLYCOSYLATED HEMOGLOBIN (HGB A1C): Hemoglobin A1C: 7 % — AB (ref 4.0–5.6)

## 2020-05-31 MED ORDER — OZEMPIC (2 MG/DOSE) 8 MG/3ML ~~LOC~~ SOPN: 2.0000 mg | PEN_INJECTOR | SUBCUTANEOUS | 3 refills | Status: DC
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
## 2020-05-31 NOTE — Patient Instructions (Addendum)
I have sent a prescription to your pharmacy, to increase the Ozempic again. Please continue the same other diabetes medications.   check your blood sugar once a day.  vary the time of day when you check, between before the 3 meals, and at bedtime.  also check if you have symptoms of your blood sugar being too high or too low.  please keep a record of the readings and bring it to your next appointment here (or you can bring the meter itself).  You can write it on any piece of paper.  please call us sooner if your blood sugar goes below 70, or if you have a lot of readings over 200.   Please come back for a follow-up appointment in 5 months.

## 2020-05-31 NOTE — Telephone Encounter (Signed)
Patient called re: New Rx for Ozempic with dosage change was sent to the wrong PHARM ([facility]). Patient has been using: [facility] - [place], [place] - [address] Phone:  [phone]  Fax:  [phone]     And requests the above RX with dosage be sent to [facility] (Preferred PHARM)

## 2020-06-05 ENCOUNTER — Telehealth: Payer: Self-pay | Admitting: Endocrinology

## 2020-06-05 NOTE — Telephone Encounter (Signed)
Pt calling in states that Semaglutide, 2 MG/DOSE, (OZEMPIC, 2 MG/DOSE,) 8 MG/3ML SOPN needs to be called in to  [facility] - [place], [place] - [address]

## 2020-06-06 ENCOUNTER — Other Ambulatory Visit: Payer: Self-pay | Admitting: *Deleted

## 2020-06-06 MED ORDER — OZEMPIC (2 MG/DOSE) 8 MG/3ML ~~LOC~~ SOPN: 2.0000 mg | PEN_INJECTOR | SUBCUTANEOUS | 3 refills | Status: DC

## 2020-06-06 NOTE — Telephone Encounter (Signed)
Rx sent 

## 2020-07-08 ENCOUNTER — Other Ambulatory Visit: Payer: Self-pay | Admitting: Endocrinology

## 2020-08-14 ENCOUNTER — Other Ambulatory Visit: Payer: Self-pay | Admitting: Endocrinology

## 2020-08-31 ENCOUNTER — Other Ambulatory Visit: Payer: Self-pay | Admitting: Obstetrics & Gynecology

## 2020-09-14 ENCOUNTER — Telehealth: Payer: Self-pay | Admitting: Endocrinology

## 2020-09-14 NOTE — Telephone Encounter (Signed)
Pt calling in stating that [facility] not have the 2mg  of ozempic. States that they only have the 1mg . Pt is wondering if a prescription can be sent in for the 1mg  and  or if not what the alternatives would be.   [facility] - [place], [place] - [address]

## 2020-09-17 ENCOUNTER — Other Ambulatory Visit: Payer: Self-pay | Admitting: Endocrinology

## 2020-09-17 MED ORDER — SEMAGLUTIDE (1 MG/DOSE) 4 MG/3ML ~~LOC~~ SOPN: 2.0000 mg | PEN_INJECTOR | SUBCUTANEOUS | 3 refills | Status: DC

## 2020-09-21 NOTE — Telephone Encounter (Signed)
Pt has changed her mind and wants it sent to    [id number] - [place], [place] - [address]  Semaglutide, 1 MG/DOSE, 4 MG/3ML SOPN

## 2020-09-22 ENCOUNTER — Other Ambulatory Visit: Payer: Self-pay

## 2020-09-26 ENCOUNTER — Telehealth: Payer: Self-pay

## 2020-09-26 ENCOUNTER — Other Ambulatory Visit: Payer: Self-pay

## 2020-09-26 ENCOUNTER — Other Ambulatory Visit: Payer: Self-pay | Admitting: Endocrinology

## 2020-09-26 MED ORDER — TRULICITY 4.5 MG/0.5ML ~~LOC~~ SOAJ: 4.5000 mg | SUBCUTANEOUS | 3 refills | Status: DC

## 2020-09-26 NOTE — Telephone Encounter (Signed)
I called said that insurance will not  pay for 2 of the 1 mg pens. Ozempic ,please advise

## 2020-09-26 NOTE — Telephone Encounter (Signed)
Called and spoke with patient to inform her this rx change, pt wanted to know the cost inform her that she check with pharmacy , however if the cost is to much I told her call back to let us know.

## 2020-09-26 NOTE — Telephone Encounter (Signed)
Patient called to advise that no one can give her a price for Trulicity so she is asking if she can just go back to 1 MG Ozempic.  States that the 2 MG Ozempic has been causing GI upset and that going back to 1 MG would not be a problem for her if Dr Everardo All feels 1 MG would be effective for her. Call back # [phone]

## 2020-09-26 NOTE — Telephone Encounter (Signed)
I called said that insurance will not  pay for 2 of the 1 mg pens. Ozempic ,please advise  

## 2020-10-02 ENCOUNTER — Ambulatory Visit (INDEPENDENT_AMBULATORY_CARE_PROVIDER_SITE_OTHER): Payer: Medicare Other | Admitting: Endocrinology

## 2020-10-02 ENCOUNTER — Other Ambulatory Visit: Payer: Self-pay

## 2020-10-02 ENCOUNTER — Telehealth: Payer: Self-pay | Admitting: Pharmacy Technician

## 2020-10-02 VITALS — BP 108/68 | HR 71 | Ht 68.5 in | Wt 218.0 lb

## 2020-10-02 DIAGNOSIS — E1142 Type 2 diabetes mellitus with diabetic polyneuropathy: Secondary | ICD-10-CM | POA: Diagnosis not present

## 2020-10-02 LAB — POCT GLYCOSYLATED HEMOGLOBIN (HGB A1C): Hemoglobin A1C: 6.9 % — AB (ref 4.0–5.6)
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
## 2020-10-02 NOTE — Telephone Encounter (Addendum)
Patient Advocate Encounter   Received notification from [employer] that prior authorization for [facility] is required.   PA submitted on 10/02/2020 Key [id number] Status is APPROVED  CaseId:[id number];Status:Approved;Review Type:Prior Auth;Coverage Start Date:09/02/2020;Coverage End Date:10/02/2021;    [facility] will continue to follow   Jeannette How, CPhT Patient Advocate [facility] Phone: [phone] Fax:  [phone]

## 2020-10-02 NOTE — Patient Instructions (Addendum)
Please continue the same 4 diabetes medications.   If you stomach symptoms persist, we'll have to reduce the Trulicity.   check your blood sugar once a day.  vary the time of day when you check, between before the 3 meals, and at bedtime.  also check if you have symptoms of your blood sugar being too high or too low.  please keep a record of the readings and bring it to your next appointment here (or you can bring the meter itself).  You can write it on any piece of paper.  please call us sooner if your blood sugar goes below 70, or if you have a lot of readings over 200.   Please come back for a follow-up appointment in January.

## 2020-11-20 ENCOUNTER — Telehealth: Payer: Self-pay | Admitting: Endocrinology

## 2020-11-20 NOTE — Telephone Encounter (Signed)
Message sent thru MyChart 

## 2020-11-20 NOTE — Telephone Encounter (Signed)
Patient is stating since she has been taking Dulaglutide (TRULICITY) 4.5 MG/0.5ML SOPN [[id number]] she is having diarrhea requesting the nurse call her for this issue

## 2021-01-04 IMAGING — NM NM BONE 3 PHASE
10 series · 20 of 20 positions shown · non-contrast
Comparison: None

Radiographic correlation: None

CLINICAL DATA: RIGHT knee pain for the past year, lateral RIGHT
knee swelling, diabetes mellitus, prior BILATERAL knee arthroplasty,
RIGHT 3028 and LEFT 6335

EXAM:
NUCLEAR MEDICINE 3-PHASE BONE SCAN
TECHNIQUE: Radionuclide angiographic images, immediate static blood pool
images, and 3-hour delayed static images were obtained of the knees
after intravenous injection of radiopharmaceutical.
RADIOPHARMACEUTICALS:  21.3 mCi Oc-VVm MDP IV

[Series 1: flow · 2.07mm/px · 6 of 48 frames shown (1 of 2)]
[frame 5/48]
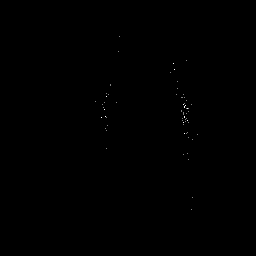
[frame 13/48]
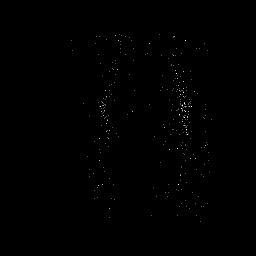
[frame 21/48  full-range]
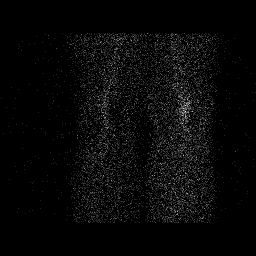
[frame 29/48  full-range]
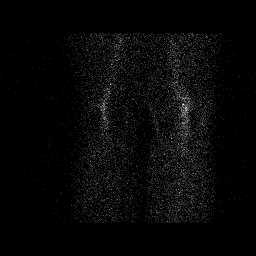
[frame 37/48  full-range]
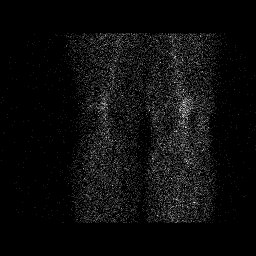
[frame 45/48  full-range]
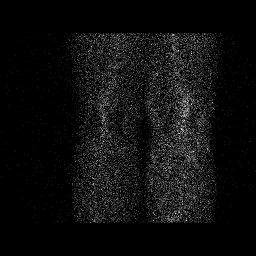

[Series 1: flow · 2.07mm/px · 6 of 48 frames shown (2 of 2)]
[frame 5/48]
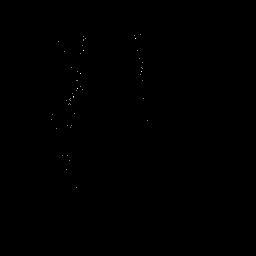
[frame 13/48]
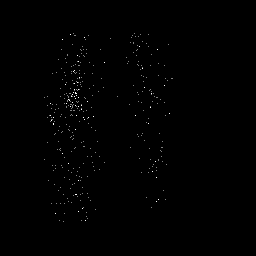
[frame 21/48  full-range]
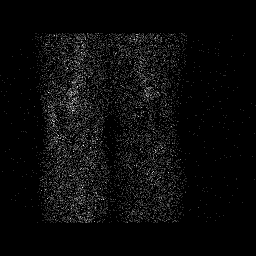
[frame 29/48  full-range]
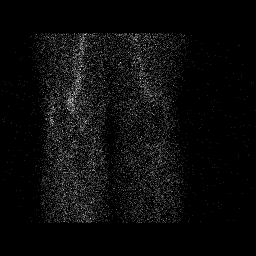
[frame 37/48  full-range]
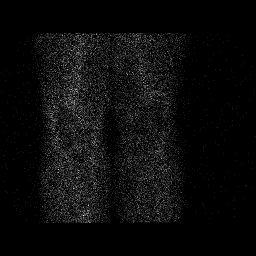
[frame 45/48  full-range]
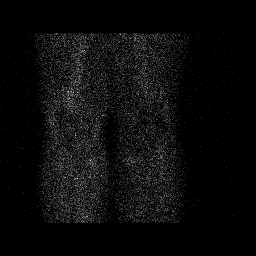

[Series 2: blood pool · 2.07mm/px · 1 of 1 slices shown (1 of 2)]
[im 1/1  full-range]
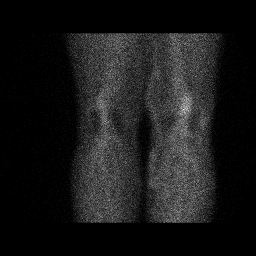

[Series 2: blood pool · 2.07mm/px · 1 of 1 slices shown (2 of 2)]
[im 1/1  full-range]
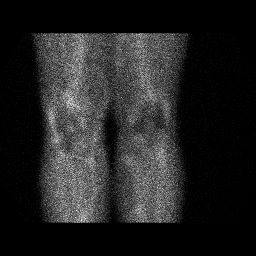

[Series 3: lat bp · 2.07mm/px · 1 of 1 slices shown (1 of 2)]
[im 1/1  full-range]
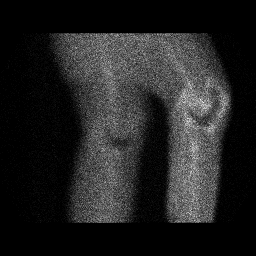

[Series 3: lat bp · 2.07mm/px · 1 of 1 slices shown (2 of 2)]
[im 1/1  full-range]
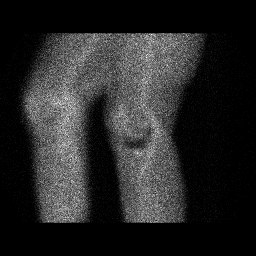

[Series 4: delay · delayed · 2.07mm/px · 1 of 1 slices shown (1 of 4)]
[im 1/1]
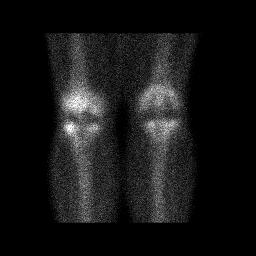

[Series 4: delay · delayed · 2.07mm/px · 1 of 1 slices shown (2 of 4)]
[im 1/1  full-range]
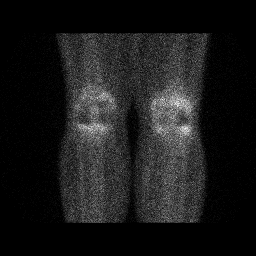

[Series 5: delay · delayed · 2.07mm/px · 1 of 1 slices shown (3 of 4)]
[im 1/1]
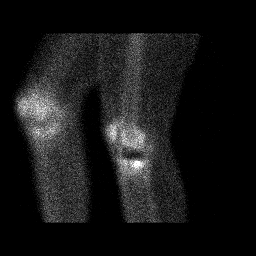

[Series 5: delay · delayed · 2.07mm/px · 1 of 1 slices shown (4 of 4)]
[im 1/1]
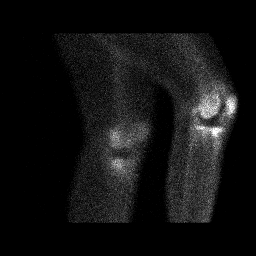

[20 of 20 positions shown; findings below may reference images not displayed]

FINDINGS: Vascular phase: Mildly increased blood flow to the periarticular
regions of both knees RIGHT greater than LEFT

Blood pool phase: Mildly increased blood pool at the periarticular
regions of both knees RIGHT greater than LEFT

Delayed phase: Photopenic defects at both knees from prosthetic
hardware. No abnormal focal osseous tracer accumulation is
identified adjacent to the LEFT knee prosthesis. On the RIGHT
however, focal abnormal increased tracer uptake is identified at the
RIGHT lateral tibial plateau adjacent to the prosthesis suspicious
for aseptic loosening.
IMPRESSION: Mildly increased blood flow and blood pool at the periarticular
regions of both knees slightly greater on RIGHT.

Focal abnormal increased tracer uptake at the RIGHT lateral tibial
plateau suspicious for aseptic loosening of the RIGHT knee
prosthesis.

No scintigraphic evidence of aseptic loosening of the LEFT knee
prosthesis.

## 2021-01-07 ENCOUNTER — Telehealth: Payer: Self-pay | Admitting: Endocrinology

## 2021-01-07 ENCOUNTER — Other Ambulatory Visit: Payer: Self-pay | Admitting: Endocrinology

## 2021-01-07 MED ORDER — VICTOZA 18 MG/3ML ~~LOC~~ SOPN: 1.8000 mg | PEN_INJECTOR | Freq: Every day | SUBCUTANEOUS | 3 refills | Status: DC

## 2021-01-07 NOTE — Telephone Encounter (Signed)
PT has confirmed with pharmacy that the following medication is on back order with no confirmation on refill availability. PT states only having enough to last until 01/20/21. Wanting to know if alternate available?  Dulaglutide (TRULICITY) 4.5 MG/0.5ML SOPN  Call PT [phone]

## 2021-01-07 NOTE — Telephone Encounter (Signed)
Message sent thru MyChart 

## 2021-01-07 NOTE — Telephone Encounter (Signed)
PT called concerning the below medication

## 2021-01-08 ENCOUNTER — Other Ambulatory Visit: Payer: Self-pay | Admitting: Endocrinology

## 2021-01-09 ENCOUNTER — Other Ambulatory Visit (HOSPITAL_COMMUNITY): Payer: Self-pay

## 2021-01-09 ENCOUNTER — Telehealth: Payer: Self-pay | Admitting: Pharmacy Technician

## 2021-01-09 NOTE — Telephone Encounter (Signed)
Patient Advocate Encounter  Received notification from [employer] ([employer]) that prior authorization for VICTOZA 18MG /3ML is required.   PA submitted on 12.14.22 Key [id number] Status is pending   [facility] will continue to follow  12.16.22, CPhT Patient Advocate [facility] Phone: [phone] Fax:  [phone]

## 2021-01-09 NOTE — Telephone Encounter (Signed)
Patient Advocate Encounter  Prior Authorization for Victoza 18mg /16ml has been approved.    PA# 1m  Effective dates: 12/10/20 through 01/09/22  Per Test Claim Patients co-pay is N/A.   Spoke with Pharmacy to Process.  Patient Advocate Fax:  [phone]

## 2021-01-14 ENCOUNTER — Encounter: Payer: Self-pay | Admitting: Endocrinology

## 2021-01-18 ENCOUNTER — Other Ambulatory Visit: Payer: Self-pay | Admitting: Endocrinology

## 2021-01-18 MED ORDER — SEMAGLUTIDE (1 MG/DOSE) 4 MG/3ML ~~LOC~~ SOPN: 1.0000 mg | PEN_INJECTOR | SUBCUTANEOUS | 3 refills | Status: DC

## 2021-02-05 ENCOUNTER — Ambulatory Visit (INDEPENDENT_AMBULATORY_CARE_PROVIDER_SITE_OTHER): Payer: Medicare Other | Admitting: Endocrinology

## 2021-02-05 ENCOUNTER — Other Ambulatory Visit: Payer: Self-pay

## 2021-02-05 VITALS — BP 150/80 | HR 69 | Ht 68.5 in | Wt 213.4 lb

## 2021-02-05 DIAGNOSIS — E1142 Type 2 diabetes mellitus with diabetic polyneuropathy: Secondary | ICD-10-CM

## 2021-02-05 LAB — POCT GLYCOSYLATED HEMOGLOBIN (HGB A1C): Hemoglobin A1C: 7 % — AB (ref 4.0–5.6)
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
## 2021-02-05 NOTE — Patient Instructions (Addendum)
Please continue the same 4 diabetes medications.  check your blood sugar once a day.  vary the time of day when you check, between before the 3 meals, and at bedtime.  also check if you have symptoms of your blood sugar being too high or too low.  please keep a record of the readings and bring it to your next appointment here (or you can bring the meter itself).  You can write it on any piece of paper.  please call us sooner if your blood sugar goes below 70, or if you have a lot of readings over 200. Please come back for a follow-up appointment in 3 months.   

## 2021-02-11 ENCOUNTER — Other Ambulatory Visit: Payer: Self-pay

## 2021-02-11 ENCOUNTER — Encounter: Payer: Self-pay | Admitting: Endocrinology

## 2021-02-11 DIAGNOSIS — E1142 Type 2 diabetes mellitus with diabetic polyneuropathy: Secondary | ICD-10-CM

## 2021-02-11 MED ORDER — ONETOUCH VERIO VI STRP: ORAL_STRIP | 12 refills | Status: AC

## 2021-03-15 ENCOUNTER — Encounter: Payer: Self-pay | Admitting: Endocrinology

## 2021-03-28 ENCOUNTER — Other Ambulatory Visit (HOSPITAL_COMMUNITY): Payer: Self-pay

## 2021-05-14 ENCOUNTER — Encounter: Payer: Self-pay | Admitting: Endocrinology

## 2021-05-14 ENCOUNTER — Ambulatory Visit (INDEPENDENT_AMBULATORY_CARE_PROVIDER_SITE_OTHER): Payer: Medicare Other | Admitting: Endocrinology

## 2021-05-14 VITALS — BP 142/76 | HR 66 | Ht 68.5 in | Wt 203.8 lb

## 2021-05-14 DIAGNOSIS — E1142 Type 2 diabetes mellitus with diabetic polyneuropathy: Secondary | ICD-10-CM

## 2021-05-14 LAB — POCT GLYCOSYLATED HEMOGLOBIN (HGB A1C): Hemoglobin A1C: 6.9 % — AB (ref 4.0–5.6)

## 2021-05-14 NOTE — Patient Instructions (Addendum)
Please continue the same 2 diabetes medications.    ?check your blood sugar once a day.  vary the time of day when you check, between before the 3 meals, and at bedtime.  also check if you have symptoms of your blood sugar being too high or too low.  please keep a record of the readings and bring it to your next appointment here (or you can bring the meter itself).  You can write it on any piece of paper.  please call us sooner if your blood sugar goes below 70, or if you have a lot of readings over 200.   ?You should have an endocrinology follow-up appointment in 4 months.   ?
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
## 2021-12-18 ENCOUNTER — Other Ambulatory Visit: Payer: Self-pay | Admitting: *Deleted

## 2021-12-18 ENCOUNTER — Ambulatory Visit: Payer: Medicare Other | Attending: Family Medicine

## 2021-12-18 DIAGNOSIS — R002 Palpitations: Secondary | ICD-10-CM

## 2021-12-18 NOTE — Progress Notes (Unsigned)
Enrolled for [employer] to mail a ZIO XT long term holter monitor to the patients address on file.   DOD TO READ.

## 2021-12-23 DIAGNOSIS — R002 Palpitations: Secondary | ICD-10-CM

## 2022-04-16 ENCOUNTER — Ambulatory Visit (INDEPENDENT_AMBULATORY_CARE_PROVIDER_SITE_OTHER): Payer: Medicare Other | Admitting: Family Medicine

## 2022-04-16 ENCOUNTER — Other Ambulatory Visit: Payer: Self-pay

## 2022-04-16 VITALS — BP 130/74 | Ht 66.5 in | Wt 200.0 lb

## 2022-04-16 DIAGNOSIS — M25511 Pain in right shoulder: Secondary | ICD-10-CM

## 2022-04-16 MED ORDER — METHOCARBAMOL 500 MG PO TABS: 500.0000 mg | ORAL_TABLET | Freq: Three times a day (TID) | ORAL | 1 refills | Status: DC | PRN

## 2022-04-16 MED ORDER — METHYLPREDNISOLONE ACETATE 40 MG/ML IJ SUSP
40.0000 mg | Freq: Once | INTRAMUSCULAR | Status: AC
  Administered 2022-04-16: 40 mg via INTRA_ARTICULAR

## 2022-04-16 MED ORDER — MELOXICAM 15 MG PO TABS: 15.0000 mg | ORAL_TABLET | Freq: Every day | ORAL | 1 refills | Status: DC

## 2022-04-16 NOTE — Patient Instructions (Signed)
Your exam and ultrasound are overall reassuring. No new rotator cuff tears but we see tendinopathy of the supraspinatus and infraspinatus along with pretty notable subacromial bursitis. Try to avoid painful activities (overhead activities, lifting with extended arm) as much as possible. Meloxicam 15mg  daily with food for pain and inflammation as needed. Can take tylenol in addition to this. Subacromial injection may be beneficial to help with pain and to decrease inflammation - you were given this today. Consider physical therapy with transition to home exercise program. Do home exercise program with theraband and scapular stabilization exercises daily 3 sets of 10 once a day. Follow up with me in 1 month.

## 2022-04-16 NOTE — Progress Notes (Unsigned)
    SUBJECTIVE:   CHIEF COMPLAINT / HPI: Right shoulder pain  Of note, patient fell on elbow 14 years ago and had a rotator cuff tear which was repaired surgically.  Doing fine throughout the years in terms of pain.  3 weeks ago started having pain around this right shoulder again.  She has been doing a lot of Spring cleaning, yard work and Pilates  She mentions that she has had 8 knee surgeries-she does not have great mobility of her right knee she has been having to use her arms to get up out of chairs/staff as more as well  Been taking meloxicam 15 mg daily and methocarbomal 750 mg at night an very helpful for her pain  Like the pain has calm down a lot but is still present.  At night she wakes up in pain from her right shoulder if she does not take her methocarbamol and she can't lay on right side at night  She has most of her pain with internal rotation and abduction  Hearing some clicks when moving shoulder  PERTINENT  PMH / PSH: T2DM, prior right rotator cuff repair  OBJECTIVE:   BP 130/74   Ht 5' 6.5" (1.689 m)   Wt 200 lb (90.7 kg)   BMI 31.80 kg/m    Gen: NAD, awake, alert, responsive to all questions Bilateral Shoulder: Inspection reveals no obvious deformity, atrophy, or asymmetry b/l. No bruising. No swelling Palpation is normal with no TTP over [facility] joint or bicipital groove b/l. Full ROM in flexion, abduction, internal/external rotation b/l - some pain with internal rotation and abduction above 120 on right NV intact distally b/l Normal scapular function observed b/l Special Tests:  - Impingement: Neg Hawkins, neers, pos empty can sign on right. - Supraspinatous: pos empty can on right - Infraspinatous/Teres Minor: 5/5 strength with ER - Subscapularis: 5/5 strength with IR (but does have pain on right) - Biceps tendon: Pos Speeds - Pos painful arc, no drop arm sign   Complete Shoulder Ultrasound ***  Bursa Injection ***  ASSESSMENT/PLAN:   Acute  pain of right shoulder 67 year old here with right shoulder pain for the past 3 weeks.  History of surgical interventions for rotator cuff repair 14 years ago.  On ultrasound performed by Dr. Barbaraann Barthel significant bursitis present which is likely cause of patient's acute pain.  No full-thickness tear is seen on complete ultrasound. Corticosteroid injection into shoudler bursa performed by Dr. Barbaraann Barthel in office today.  -Meloxicam refilled -Robaxin refilled -F/u if no improvement or worsening    Gerrit Heck, MD [place]

## 2022-04-16 NOTE — Assessment & Plan Note (Signed)
67 year old here with right shoulder pain for the past 3 weeks.  History of surgical interventions for rotator cuff repair 14 years ago.  On ultrasound performed by Dr. Barbaraann Barthel significant bursitis present which is likely cause of patient's acute pain.  No full-thickness tear is seen on complete ultrasound. Corticosteroid injection into shoudler bursa performed by Dr. Barbaraann Barthel in office today.  -Meloxicam refilled -Robaxin refilled -F/u if no improvement or worsening

## 2022-04-17 ENCOUNTER — Encounter: Payer: Self-pay | Admitting: Family Medicine

## 2022-05-05 ENCOUNTER — Ambulatory Visit (INDEPENDENT_AMBULATORY_CARE_PROVIDER_SITE_OTHER): Payer: Medicare Other | Admitting: Family Medicine

## 2022-05-05 VITALS — BP 132/84 | Ht 66.5 in | Wt 200.0 lb

## 2022-05-05 DIAGNOSIS — M25511 Pain in right shoulder: Secondary | ICD-10-CM | POA: Diagnosis present

## 2022-05-05 MED ORDER — DICLOFENAC SODIUM 75 MG PO TBEC: 75.0000 mg | DELAYED_RELEASE_TABLET | Freq: Two times a day (BID) | ORAL | 1 refills | Status: DC

## 2022-05-05 NOTE — Patient Instructions (Signed)
Stop the meloxicam. Take diclofenac 75mg  twice a day with food. Motion exercises are ok right now but I wouldn't do any band exercises for about the next week - as you're feeling better you can resume them and your scapular strengthening. Follow up with me in the next 1-2 weeks if this isn't going the right direction and would consider repeating the injection. Consider formal physical therapy, repeating the ultrasound as well. Otherwise if you're doing well follow up in 1 month or as needed.

## 2022-05-06 ENCOUNTER — Encounter: Payer: Self-pay | Admitting: Family Medicine
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
## 2022-05-19 ENCOUNTER — Ambulatory Visit: Payer: Medicare Other | Admitting: Family Medicine

## 2022-05-21 ENCOUNTER — Encounter: Payer: Self-pay | Admitting: Family Medicine

## 2022-05-23 ENCOUNTER — Encounter: Payer: Self-pay | Admitting: Family Medicine

## 2022-05-23 ENCOUNTER — Other Ambulatory Visit: Payer: Self-pay

## 2022-05-23 DIAGNOSIS — M25511 Pain in right shoulder: Secondary | ICD-10-CM

## 2022-05-29 ENCOUNTER — Encounter: Payer: Self-pay | Admitting: Family Medicine
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
## 2022-06-26 ENCOUNTER — Other Ambulatory Visit: Payer: Self-pay

## 2022-06-26 ENCOUNTER — Ambulatory Visit: Payer: Medicare Other | Attending: Family Medicine | Admitting: Physical Therapy

## 2022-06-26 ENCOUNTER — Encounter: Payer: Self-pay | Admitting: Physical Therapy

## 2022-06-26 DIAGNOSIS — M6281 Muscle weakness (generalized): Secondary | ICD-10-CM | POA: Insufficient documentation

## 2022-06-26 DIAGNOSIS — M25511 Pain in right shoulder: Secondary | ICD-10-CM | POA: Diagnosis present

## 2022-06-26 NOTE — Patient Instructions (Signed)

## 2022-07-03 ENCOUNTER — Other Ambulatory Visit: Payer: Self-pay | Admitting: Family Medicine

## 2022-07-15 ENCOUNTER — Encounter: Payer: Self-pay | Admitting: Physical Therapy

## 2022-07-15 ENCOUNTER — Ambulatory Visit: Payer: Medicare Other | Attending: Family Medicine | Admitting: Physical Therapy

## 2022-07-15 DIAGNOSIS — M6281 Muscle weakness (generalized): Secondary | ICD-10-CM | POA: Insufficient documentation

## 2022-07-15 DIAGNOSIS — M25511 Pain in right shoulder: Secondary | ICD-10-CM | POA: Insufficient documentation
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
## 2022-07-17 ENCOUNTER — Encounter: Payer: Self-pay | Admitting: Physical Therapy

## 2022-07-17 ENCOUNTER — Ambulatory Visit: Payer: Medicare Other | Admitting: Physical Therapy

## 2022-07-17 DIAGNOSIS — M25511 Pain in right shoulder: Secondary | ICD-10-CM

## 2022-07-17 DIAGNOSIS — M6281 Muscle weakness (generalized): Secondary | ICD-10-CM
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
## 2022-07-22 ENCOUNTER — Encounter: Payer: Self-pay | Admitting: Physical Therapy

## 2022-07-22 ENCOUNTER — Ambulatory Visit: Payer: Medicare Other | Admitting: Physical Therapy

## 2022-07-22 DIAGNOSIS — M25511 Pain in right shoulder: Secondary | ICD-10-CM | POA: Diagnosis not present

## 2022-07-22 DIAGNOSIS — M6281 Muscle weakness (generalized): Secondary | ICD-10-CM
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
## 2022-07-29 ENCOUNTER — Encounter: Payer: Self-pay | Admitting: Physical Therapy

## 2022-07-29 ENCOUNTER — Ambulatory Visit: Payer: Medicare Other | Attending: Family Medicine | Admitting: Physical Therapy

## 2022-07-29 DIAGNOSIS — M6281 Muscle weakness (generalized): Secondary | ICD-10-CM | POA: Diagnosis present

## 2022-07-29 DIAGNOSIS — M25511 Pain in right shoulder: Secondary | ICD-10-CM | POA: Insufficient documentation
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
## 2022-07-29 NOTE — Patient Instructions (Addendum)
Access Code: [email or web address] URL: [email or web address] Date: 07/29/2022 Prepared by: Garen Lah  Program Notes 1) ) side-lying eccentric External rotation with green theraband as shown in clinic 2) Sidelying trio with resisted ER, abduction and flexion with dumbbell weight and progress REMEMBER to LENGTHEN as you do exercise.   Exercises - Seated Cervical Retraction  - 3 x daily - 7 x weekly - 1 sets - 10 reps - Sidelying Shoulder ER with Towel and Dumbbell  - 1 x daily - 7 x weekly - 3 sets - 10 reps - Side-lying Shoulder PNF D2 Flexion and Extension  - 1 x daily - 7 x weekly - 3 sets - 10 reps - Shoulder extension with resistance - Neutral  - 1 x daily - 7 x weekly - 3 sets - 10 reps - Shoulder External Rotation and Scapular Retraction  - 3 x daily - 7 x weekly - 1 sets - 10 reps -   hold - Standing Shoulder Diagonal Horizontal Abduction 60/120 Degrees with Resistance  - 1 x daily - 7 x weekly - 3 sets - 10 reps - Standing Shoulder Horizontal Abduction with Resistance  - 1 x daily - 7 x weekly - 3 sets - 10 reps - Doorway Pec Stretch at 90 Degrees Abduction  - 1 x daily - 7 x weekly - 1 sets - 3 reps - 20 sec hold - Wall Push Up  - 1 x daily - 7 x weekly - 1-3 sets - 10 reps - Bench press for a minute  - 1 x daily - 7 x weekly - 3 sets - 10 reps - Scaption with Resistance  - 1 x daily - 7 x weekly - 3 sets - 10 reps  Garen Lah, PT, [facility] Certified Exercise Expert for the Aging Adult  07/29/22 9:27 AM Phone: [phone] Fax: [phone]

## 2022-08-05 ENCOUNTER — Ambulatory Visit: Payer: Medicare Other | Admitting: Physical Therapy

## 2022-08-05 ENCOUNTER — Encounter: Payer: Self-pay | Admitting: Physical Therapy

## 2022-08-05 DIAGNOSIS — M25511 Pain in right shoulder: Secondary | ICD-10-CM
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
## 2022-08-12 ENCOUNTER — Ambulatory Visit: Payer: Medicare Other | Admitting: Physical Therapy

## 2022-08-12 ENCOUNTER — Encounter: Payer: Self-pay | Admitting: Physical Therapy

## 2022-08-12 DIAGNOSIS — M25511 Pain in right shoulder: Secondary | ICD-10-CM | POA: Diagnosis not present

## 2022-08-12 DIAGNOSIS — M6281 Muscle weakness (generalized): Secondary | ICD-10-CM

## 2022-08-12 NOTE — Patient Instructions (Signed)
Every Sunday fill out a schedule for your week:  Fill in your weekly activities and appointments to help determine which days would be best for more exercises Determine days and time you can walk Daily do your flexibility exercises right after you get out of bed in the morning.  The ones that you use to make your back feel better.    Fill in your balance days, at least 3 days of balance for at least 15 minutes -Use your balance exercises given   Fill in your strengthening days, 2-3 times per week of 8 exercises (can make this less to start) Like the AMRAP(As many repetitions as possible)  or EMOM(every minute on the minute) to start You can use your phone and download the BS-free interval timer    Fill in your flexibility days, 3-4 times per week of all major muscle groups- That is your Home EX for preparing you for tennis  Why you need to progress exercises:   Progression will keep you challenged and continue to improve your pain, balance, and strength   If you keep going the same exercises in the same amounts you won't be getting any stronger!   You know you can progress when the exercise feels 1-3/10. You want to stay between a 3-6/10 difficulty.  Walking Progression   Walk increasingly longer duration (instead of 3 sets of 10 minutes try 3 sets of 12 minutes or 2 sets of 15 minutes) until you can walk about 30 minutes without stopping    Progress to Fill in your walking days to total 150 minutes/week  of moderate intensity walking (can be over 5 days) can walk 30 min x 5 days or 50 min 3 days    Walk outside to challenge your balance   Walk and talk with someone to challenge your balance/walking abilities   Walk faster and with more difficulty   Looking for 4-6/10 difficulty  Balance Progression   Initially start with 15 minutes of pure balance work, you want to make sure you get unsteady a few times on the balance exercises so that you learn how to balance   Static Balance: balance while in  the same position ? Change base of support: narrow, tandem, 1 leg, one foot in front ? Change upper body support: go from 2 hands to 1 hand to fingertips to no hands ? Eyes Open/Closed ? Change the surface: balance pad, shoes off, carpet   Dynamic Balance: balance while in motion ? Change upper body support ? Add Objects/Weight: basketball, balls, dumbbell ? Move more/bigger: walk along the counter, get on/off the floor, take bigger steps, take higher steps ? Change Surfaces: Balance pad, carpet ? Eyes Open/Closed ? Add a cognitive task: naming animals/holidays, counting by 3's   Examples: ? Static: standing on one leg with decreasing upper body support or eyes closed ? Dynamic: jumping, progress by holding a basketball  Strength Progression   Recommended to do 2-3 days per week with about 8-10 exercises   Progress by: ? Adding more weight ? Add more repetitions (the last three repetitions should feel difficult) ? Add tempo, for example in the mini squat: go down slowly, stand up quickly, hold in the bottom   Structure of exercises: ? 3 rounds of exercise x, y, z with 1 minute rest between rounds ? [place]: :20 on/:10 off x how every many rounds you want to do ? EMOM (Every minute on the minute): Ex.. 1 min chair squats, 1 min  step ups, 1 min lunges,1 min overhead power, 1 min rest. X 4  Guidelines for Pain   You can exercise with pain!   Pick exercises that feel good, help to reduce your pain, or have no pain at all ? If an exercise increases pain, stop and evaluate if your pain has remained worse  ? If so, stop that exercise for a few days ? Pain should be no more than a 3/10 or increase over 2 points.   Use ice/heat as needed to manage pain prior or after exercising.  Garen Lah, PT, [facility] Certified Exercise Expert for the Aging Adult  08/12/22 9:04 AM Phone: [phone] Fax: [phone]
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
## 2022-08-19 ENCOUNTER — Encounter: Payer: Self-pay | Admitting: Physical Therapy

## 2022-08-19 ENCOUNTER — Ambulatory Visit: Payer: Medicare Other | Admitting: Physical Therapy

## 2022-08-19 DIAGNOSIS — M25511 Pain in right shoulder: Secondary | ICD-10-CM | POA: Diagnosis not present

## 2022-08-19 DIAGNOSIS — M6281 Muscle weakness (generalized): Secondary | ICD-10-CM

## 2022-09-10 ENCOUNTER — Ambulatory Visit: Payer: Medicare Other | Admitting: Family Medicine

## 2022-09-10 ENCOUNTER — Ambulatory Visit: Admission: RE | Admit: 2022-09-10 | Payer: Medicare Other | Source: Ambulatory Visit

## 2022-09-10 VITALS — BP 132/75 | Ht 66.5 in | Wt 198.0 lb

## 2022-09-10 DIAGNOSIS — M25552 Pain in left hip: Secondary | ICD-10-CM

## 2022-09-10 MED ORDER — MELOXICAM 15 MG PO TABS: 15.0000 mg | ORAL_TABLET | Freq: Every day | ORAL | 2 refills | Status: DC

## 2022-09-10 NOTE — Patient Instructions (Addendum)
Get x-rays after you leave today.   We will call you with the results. Do rehab for your hip flexor. Home exercises on days you don't go to therapy. Take meloxicam daily with food for pain and inflammation. Tylenol as needed in addition to this is ok. Follow up with me in 1 month.
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
## 2022-09-19 ENCOUNTER — Other Ambulatory Visit: Payer: Self-pay | Admitting: *Deleted

## 2022-09-19 DIAGNOSIS — M25552 Pain in left hip: Secondary | ICD-10-CM

## 2022-09-19 DIAGNOSIS — M25551 Pain in right hip: Secondary | ICD-10-CM

## 2022-09-23 ENCOUNTER — Telehealth (HOSPITAL_BASED_OUTPATIENT_CLINIC_OR_DEPARTMENT_OTHER): Payer: Self-pay

## 2022-10-03 ENCOUNTER — Ambulatory Visit (HOSPITAL_BASED_OUTPATIENT_CLINIC_OR_DEPARTMENT_OTHER)
Admission: RE | Admit: 2022-10-03 | Discharge: 2022-10-03 | Disposition: A | Discharge status: Home / Self Care | Payer: Medicare Other | Source: Ambulatory Visit | Attending: Family Medicine | Admitting: Family Medicine

## 2022-10-03 ENCOUNTER — Encounter (HOSPITAL_BASED_OUTPATIENT_CLINIC_OR_DEPARTMENT_OTHER): Payer: Self-pay

## 2022-10-03 DIAGNOSIS — M25551 Pain in right hip: Secondary | ICD-10-CM | POA: Insufficient documentation

## 2022-10-03 MED ORDER — IOHEXOL 300 MG/ML  SOLN
75.0000 mL | Freq: Once | INTRAMUSCULAR | Status: AC | PRN
  Administered 2022-10-03: 75 mL via INTRAVENOUS

## 2022-10-09 ENCOUNTER — Ambulatory Visit: Payer: Medicare Other | Admitting: Family Medicine

## 2022-10-15 ENCOUNTER — Ambulatory Visit (INDEPENDENT_AMBULATORY_CARE_PROVIDER_SITE_OTHER): Payer: Medicare Other | Admitting: Family Medicine

## 2022-10-15 VITALS — BP 124/76 | Ht 66.5 in | Wt 198.0 lb

## 2022-10-15 DIAGNOSIS — M25552 Pain in left hip: Secondary | ICD-10-CM | POA: Diagnosis present

## 2022-10-15 NOTE — Patient Instructions (Signed)
Your pain is due to arthritis and hip flexor strain. These are the different medications you can take for this: Tylenol 500mg  1-2 tabs three times a day for pain. Capsaicin, aspercreme, or biofreeze topically up to four times a day may also help with pain. Some supplements that may help for arthritis: Boswellia extract, curcumin, pycnogenol Aleve 1-2 tabs twice a day with food Cortisone injections are an option if pain is severe. It's important that you continue to stay active. Do home exercises daily as directed. Consider physical therapy to strengthen muscles around the joint that hurts to take pressure off of the joint itself. Heat or ice 15 minutes at a time 3-4 times a day as needed to help with pain. Water aerobics and cycling with low resistance are the best two types of exercise for arthritis though any exercise is ok as long as it doesn't worsen the pain. Follow up with me in 6 weeks or as needed.
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
## 2022-10-27 ENCOUNTER — Telehealth: Payer: Self-pay | Admitting: Family Medicine

## 2022-10-27 MED ORDER — METHOCARBAMOL 500 MG PO TABS: 500.0000 mg | ORAL_TABLET | Freq: Three times a day (TID) | ORAL | 1 refills | Status: DC | PRN

## 2022-10-27 NOTE — Telephone Encounter (Signed)
Ok sent it in for her.  Thanks!

## 2022-10-27 NOTE — Telephone Encounter (Signed)
-----   Message from [facility] sent at 10/24/2022  9:51 AM EDT ----- Regarding: FW: refill request  ----- Message ----- From: Lizbeth Bark Sent: 10/24/2022   9:23 AM EDT To: Rutha Bouchard, LAT Subject: refill request                                 Pt left vm asking if Rosalee Tolley would refill methocarbamol (ROBAXIN) 500 MG tablet for back spasm

## 2022-11-24 ENCOUNTER — Other Ambulatory Visit: Payer: Self-pay

## 2022-11-24 ENCOUNTER — Ambulatory Visit (INDEPENDENT_AMBULATORY_CARE_PROVIDER_SITE_OTHER): Payer: Medicare Other | Admitting: Family Medicine

## 2022-11-24 VITALS — BP 138/74 | Ht 67.0 in | Wt 198.0 lb

## 2022-11-24 DIAGNOSIS — M25552 Pain in left hip: Secondary | ICD-10-CM | POA: Diagnosis present

## 2022-11-24 MED ORDER — METHYLPREDNISOLONE ACETATE 40 MG/ML IJ SUSP
40.0000 mg | Freq: Once | INTRAMUSCULAR | Status: AC
Start: 2022-11-24 — End: 2022-11-24
  Administered 2022-11-24: 40 mg via INTRA_ARTICULAR

## 2022-11-25 NOTE — Progress Notes (Signed)
Patient returns today for intraarticular left hip injection.    After informed written consent timeout was performed, patient was lying supine on exam table.  Area overlying left hip joint was prepped with alcohol swab then injected with 4:1 lidocaine: depomedrol 80mg .  Patient tolerated procedure well without immediate complications.

## 2023-01-04 ENCOUNTER — Other Ambulatory Visit: Payer: Self-pay | Admitting: Family Medicine
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
## 2023-02-06 ENCOUNTER — Ambulatory Visit: Payer: Medicare Other | Attending: Orthopedic Surgery

## 2023-02-06 ENCOUNTER — Other Ambulatory Visit: Payer: Self-pay

## 2023-02-06 DIAGNOSIS — R262 Difficulty in walking, not elsewhere classified: Secondary | ICD-10-CM | POA: Insufficient documentation

## 2023-02-06 DIAGNOSIS — M25552 Pain in left hip: Secondary | ICD-10-CM | POA: Diagnosis present

## 2023-02-06 DIAGNOSIS — G8929 Other chronic pain: Secondary | ICD-10-CM | POA: Insufficient documentation

## 2023-02-06 DIAGNOSIS — M6281 Muscle weakness (generalized): Secondary | ICD-10-CM | POA: Diagnosis present

## 2023-02-10 NOTE — Addendum Note (Signed)
 Addended by: Joellyn Rued on: 02/10/2023 03:09 PM   Modules accepted: Orders

## 2023-02-11 ENCOUNTER — Ambulatory Visit: Payer: Medicare Other

## 2023-02-13 ENCOUNTER — Ambulatory Visit: Payer: Medicare Other

## 2023-02-16 NOTE — Progress Notes (Addendum)
COVID Vaccine Completed:  Date of COVID positive in last 90 days:  PCP - Mila Palmer, MD Cardiologist - Dr. Melburn Popper LOV 05/20/19 for abn EKG. Per note f/u PRN  Clearance in media tab dated 01/13/23  Chest x-ray -  EKG -  Stress Test -  ECHO -  Cardiac Cath -  Pacemaker/ICD device last checked: Spinal Cord Stimulator:  Bowel Prep -   Sleep Study -  CPAP -   Fasting Blood Sugar -  Checks Blood Sugar _____ times a day  Last dose of GLP1 agonist-  Semaglutide GLP1 instructions:  Hold 7 days before surgery    Last dose of SGLT-2 inhibitors-  N/A SGLT-2 instructions:  Hold 3 days before surgery    Blood Thinner Instructions:  Time Aspirin Instructions: ASA 81 Last Dose:  Activity level:  Can go up a flight of stairs and perform activities of daily living without stopping and without symptoms of chest pain or shortness of breath.  Able to exercise without symptoms  Unable to go up a flight of stairs without symptoms of     Anesthesia review: DM2, HTN, OSA, fatty liver  Patient denies shortness of breath, fever, cough and chest pain at PAT appointment  Patient verbalized understanding of instructions that were given to them at the PAT appointment. Patient was also instructed that they will need to review over the PAT instructions again at home before surgery.

## 2023-02-16 NOTE — Patient Instructions (Signed)
SURGICAL WAITING ROOM VISITATION  Patients having surgery or a procedure may have no more than 2 support people in the waiting area - these visitors may rotate.    Children under the age of 25 must have an adult with them who is not the patient.  Due to an increase in RSV and influenza rates and associated hospitalizations, children ages 26 and under may not visit patients in [facility] hospitals.  Visitors with respiratory illnesses are discouraged from visiting and should remain at home.  If the patient needs to stay at the hospital during part of their recovery, the visitor guidelines for inpatient rooms apply. Pre-op nurse will coordinate an appropriate time for 1 support person to accompany patient in pre-op.  This support person may not rotate.    Please refer to the [facility] website for the visitor guidelines for Inpatients (after your surgery is over and you are in a regular room).    Your procedure is scheduled on: 03/02/23   Report to [facility] Main Entrance    Report to admitting at 7:50 AM   Call this number if you have problems the morning of surgery [phone]   Do not eat food :After Midnight.   After Midnight you may have the following liquids until 7:20 AM DAY OF SURGERY  Water Non-Citrus Juices (without pulp, NO RED-Apple, White grape, White cranberry) Black Coffee (NO MILK/CREAM OR CREAMERS, sugar ok)  Clear Tea (NO MILK/CREAM OR CREAMERS, sugar ok) regular and decaf                             Plain Jell-O (NO RED)                                           Fruit ices (not with fruit pulp, NO RED)                                     Popsicles (NO RED)                                                               Sports drinks like Gatorade (NO RED)    The day of surgery:  Drink ONE (1) Pre-Surgery G2 at 7:20 AM the morning of surgery. Drink in one sitting. Do not sip.  This drink was given to you during your hospital  pre-op appointment  visit. Nothing else to drink after completing the  Pre-Surgery G2.          If you have questions, please contact your surgeon's office.   FOLLOW BOWEL PREP AND ANY ADDITIONAL PRE OP INSTRUCTIONS YOU RECEIVED FROM YOUR SURGEON'S OFFICE!!!     Oral Hygiene is also important to reduce your risk of infection.                                    Remember - BRUSH YOUR TEETH THE MORNING OF SURGERY WITH YOUR REGULAR TOOTHPASTE  DENTURES WILL BE  REMOVED PRIOR TO SURGERY PLEASE DO NOT APPLY "Poly grip" OR ADHESIVES!!!   Stop all vitamins and herbal supplements 7 days before surgery.   Take these medicines the morning of surgery with A SIP OF WATER: Fenofibrate, Metoprolol   DO NOT TAKE ANY ORAL DIABETIC MEDICATIONS DAY OF YOUR SURGERY  How to Manage Your Diabetes Before and After Surgery  Why is it important to control my blood sugar before and after surgery? Improving blood sugar levels before and after surgery helps healing and can limit problems. A way of improving blood sugar control is eating a healthy diet by:  Eating less sugar and carbohydrates  Increasing activity/exercise  Talking with your doctor about reaching your blood sugar goals High blood sugars (greater than 180 mg/dL) can raise your risk of infections and slow your recovery, so you will need to focus on controlling your diabetes during the weeks before surgery. Make sure that the doctor who takes care of your diabetes knows about your planned surgery including the date and location.  How do I manage my blood sugar before surgery? Check your blood sugar at least 4 times a day, starting 2 days before surgery, to make sure that the level is not too high or low. Check your blood sugar the morning of your surgery when you wake up and every 2 hours until you get to the Short Stay unit. If your blood sugar is less than 70 mg/dL, you will need to treat for low blood sugar: Do not take insulin. Treat a low blood sugar (less than  70 mg/dL) with  cup of clear juice (cranberry or apple), 4 glucose tablets, OR glucose gel. Recheck blood sugar in 15 minutes after treatment (to make sure it is greater than 70 mg/dL). If your blood sugar is not greater than 70 mg/dL on recheck, call [phone] for further instructions. Report your blood sugar to the short stay nurse when you get to Short Stay.  If you are admitted to the hospital after surgery: Your blood sugar will be checked by the staff and you will probably be given insulin after surgery (instead of oral diabetes medicines) to make sure you have good blood sugar levels. The goal for blood sugar control after surgery is 80-180 mg/dL.   WHAT DO I DO ABOUT MY DIABETES MEDICATION?  Do not take oral diabetes medicines (pills) the morning of surgery.  Hold Ozempic 7 days prior. Do not take 02/27/23  Hold Farxiga 3 days prior. Last dose 02/27/23.  THE DAY BEFORE SURGERY, take Toujeo as prescribed.     THE MORNING OF SURGERY, take 50% of Toujeo.  DO NOT TAKE THE FOLLOWING 7 DAYS PRIOR TO SURGERY: Ozempic, Wegovy, Rybelsus (Semaglutide), Byetta (exenatide), Bydureon (exenatide ER), Victoza, Saxenda (liraglutide), or Trulicity (dulaglutide) Mounjaro (Tirzepatide) Adlyxin (Lixisenatide), Polyethylene Glycol Loxenatide.  Reviewed and Endorsed by [facility] Patient Education Committee, August 2015  Bring CPAP mask and tubing day of surgery.                              You may not have any metal on your body including hair pins, jewelry, and body piercing             Do not wear make-up, lotions, powders, perfumes, or deodorant  Do not wear nail polish including gel and S&S, artificial/acrylic nails, or any other type of covering on natural nails including finger and toenails. If you have artificial nails,  gel coating, etc. that needs to be removed by a nail salon please have this removed prior to surgery or surgery may need to be canceled/ delayed if the surgeon/  anesthesia feels like they are unable to be safely monitored.   Do not shave  48 hours prior to surgery.    Do not bring valuables to the hospital. [facility] IS NOT             RESPONSIBLE   FOR VALUABLES.   Contacts, glasses, dentures or bridgework may not be worn into surgery.  DO NOT BRING YOUR HOME MEDICATIONS TO THE HOSPITAL. PHARMACY WILL DISPENSE MEDICATIONS LISTED ON YOUR MEDICATION LIST TO YOU DURING YOUR ADMISSION IN THE HOSPITAL!    Patients discharged on the day of surgery will not be allowed to drive home.  Someone NEEDS to stay with you for the first 24 hours after anesthesia.   Special Instructions: Bring a copy of your healthcare power of attorney and living will documents the day of surgery if you haven't scanned them before.              Please read over the following fact sheets you were given: IF YOU HAVE QUESTIONS ABOUT YOUR PRE-OP INSTRUCTIONS PLEASE CALL [phone]    If you received a COVID test during your pre-op visit  it is requested that you wear a mask when out in public, stay away from anyone that may not be feeling well and notify your surgeon if you develop symptoms. If you test positive for Covid or have been in contact with anyone that has tested positive in the last 10 days please notify you surgeon.      Pre-operative 5 CHG Bath Instructions   You can play a key role in reducing the risk of infection after surgery. Your skin needs to be as free of germs as possible. You can reduce the number of germs on your skin by washing with CHG (chlorhexidine gluconate) soap before surgery. CHG is an antiseptic soap that kills germs and continues to kill germs even after washing.   DO NOT use if you have an allergy to chlorhexidine/CHG or antibacterial soaps. If your skin becomes reddened or irritated, stop using the CHG and notify one of our RNs at [phone].   Please shower with the CHG soap starting 4 days before surgery using the following  schedule:     Please keep in mind the following:  DO NOT shave, including legs and underarms, starting the day of your first shower.   You may shave your face at any point before/day of surgery.  Place clean sheets on your bed the day you start using CHG soap. Use a clean washcloth (not used since being washed) for each shower. DO NOT sleep with pets once you start using the CHG.   CHG Shower Instructions:  If you choose to wash your hair and private area, wash first with your normal shampoo/soap.  After you use shampoo/soap, rinse your hair and body thoroughly to remove shampoo/soap residue.  Turn the water OFF and apply about 3 tablespoons (45 ml) of CHG soap to a CLEAN washcloth.  Apply CHG soap ONLY FROM YOUR NECK DOWN TO YOUR TOES (washing for 3-5 minutes)  DO NOT use CHG soap on face, private areas, open wounds, or sores.  Pay special attention to the area where your surgery is being performed.  If you are having back surgery, having someone wash your back for you may be  helpful. Wait 2 minutes after CHG soap is applied, then you may rinse off the CHG soap.  Pat dry with a clean towel  Put on clean clothes/pajamas   If you choose to wear lotion, please use ONLY the CHG-compatible lotions on the back of this paper.     Additional instructions for the day of surgery: DO NOT APPLY any lotions, deodorants, cologne, or perfumes.   Put on clean/comfortable clothes.  Brush your teeth.  Ask your nurse before applying any prescription medications to the skin.      CHG Compatible Lotions   Aveeno Moisturizing lotion  Cetaphil Moisturizing Cream  Cetaphil Moisturizing Lotion  Clairol Herbal Essence Moisturizing Lotion, Dry Skin  Clairol Herbal Essence Moisturizing Lotion, Extra Dry Skin  Clairol Herbal Essence Moisturizing Lotion, Normal Skin  Curel Age Defying Therapeutic Moisturizing Lotion with Alpha Hydroxy  Curel Extreme Care Body Lotion  Curel Soothing Hands Moisturizing  Hand Lotion  Curel Therapeutic Moisturizing Cream, Fragrance-Free  Curel Therapeutic Moisturizing Lotion, Fragrance-Free  Curel Therapeutic Moisturizing Lotion, Original Formula  Eucerin Daily Replenishing Lotion  Eucerin Dry Skin Therapy Plus Alpha Hydroxy Crme  Eucerin Dry Skin Therapy Plus Alpha Hydroxy Lotion  Eucerin Original Crme  Eucerin Original Lotion  Eucerin Plus Crme Eucerin Plus Lotion  Eucerin TriLipid Replenishing Lotion  Keri Anti-Bacterial Hand Lotion  Keri Deep Conditioning Original Lotion Dry Skin Formula Softly Scented  Keri Deep Conditioning Original Lotion, Fragrance Free Sensitive Skin Formula  Keri Lotion Fast Absorbing Fragrance Free Sensitive Skin Formula  Keri Lotion Fast Absorbing Softly Scented Dry Skin Formula  Keri Original Lotion  Keri Skin Renewal Lotion Keri Silky Smooth Lotion  Keri Silky Smooth Sensitive Skin Lotion  Nivea Body Creamy Conditioning Oil  Nivea Body Extra Enriched Teacher, adult education Moisturizing Lotion Nivea Crme  Nivea Skin Firming Lotion  NutraDerm 30 Skin Lotion  NutraDerm Skin Lotion  NutraDerm Therapeutic Skin Cream  NutraDerm Therapeutic Skin Lotion  ProShield Protective Hand Cream  Provon moisturizing lotion  WHAT IS A BLOOD TRANSFUSION? Blood Transfusion Information  A transfusion is the replacement of blood or some of its parts. Blood is made up of multiple cells which provide different functions. Red blood cells carry oxygen and are used for blood loss replacement. White blood cells fight against infection. Platelets control bleeding. Plasma helps clot blood. Other blood products are available for specialized needs, such as hemophilia or other clotting disorders. BEFORE THE TRANSFUSION  Who gives blood for transfusions?  Healthy volunteers who are fully evaluated to make sure their blood is safe. This is blood bank blood. Transfusion therapy is the safest it has ever been in the  practice of medicine. Before blood is taken from a donor, a complete history is taken to make sure that person has no history of diseases nor engages in risky social behavior (examples are intravenous drug use or sexual activity with multiple partners). The donor's travel history is screened to minimize risk of transmitting infections, such as malaria. The donated blood is tested for signs of infectious diseases, such as HIV and hepatitis. The blood is then tested to be sure it is compatible with you in order to minimize the chance of a transfusion reaction. If you or a relative donates blood, this is often done in anticipation of surgery and is not appropriate for emergency situations. It takes many days to process the donated blood. RISKS AND COMPLICATIONS Although transfusion therapy is very safe and saves many lives,  the main dangers of transfusion include:  Getting an infectious disease. Developing a transfusion reaction. This is an allergic reaction to something in the blood you were given. Every precaution is taken to prevent this. The decision to have a blood transfusion has been considered carefully by your caregiver before blood is given. Blood is not given unless the benefits outweigh the risks. AFTER THE TRANSFUSION Right after receiving a blood transfusion, you will usually feel much better and more energetic. This is especially true if your red blood cells have gotten low (anemic). The transfusion raises the level of the red blood cells which carry oxygen, and this usually causes an energy increase. The nurse administering the transfusion will monitor you carefully for complications. HOME CARE INSTRUCTIONS  No special instructions are needed after a transfusion. You may find your energy is better. Speak with your caregiver about any limitations on activity for underlying diseases you may have. SEEK MEDICAL CARE IF:  Your condition is not improving after your transfusion. You develop  redness or irritation at the intravenous (IV) site. SEEK IMMEDIATE MEDICAL CARE IF:  Any of the following symptoms occur over the next 12 hours: Shaking chills. You have a temperature by mouth above 102 F (38.9 C), not controlled by medicine. Chest, back, or muscle pain. People around you feel you are not acting correctly or are confused. Shortness of breath or difficulty breathing. Dizziness and fainting. You get a rash or develop hives. You have a decrease in urine output. Your urine turns a dark color or changes to pink, red, or brown. Any of the following symptoms occur over the next 10 days: You have a temperature by mouth above 102 F (38.9 C), not controlled by medicine. Shortness of breath. Weakness after normal activity. The white part of the eye turns yellow (jaundice). You have a decrease in the amount of urine or are urinating less often. Your urine turns a dark color or changes to pink, red, or brown. Document Released: 01/11/2000 Document Revised: 04/07/2011 Document Reviewed: 08/30/2007 [employer] Patient Information 2014 [place], [place].  _______________________________________________________________________   Incentive Spirometer  An incentive spirometer is a tool that can help keep your lungs clear and active. This tool measures how well you are filling your lungs with each breath. Taking long deep breaths may help reverse or decrease the chance of developing breathing (pulmonary) problems (especially infection) following: A long period of time when you are unable to move or be active. BEFORE THE PROCEDURE  If the spirometer includes an indicator to show your best effort, your nurse or respiratory therapist will set it to a desired goal. If possible, sit up straight or lean slightly forward. Try not to slouch. Hold the incentive spirometer in an upright position. INSTRUCTIONS FOR USE  Sit on the edge of your bed if possible, or sit up as far as you can in bed or  on a chair. Hold the incentive spirometer in an upright position. Breathe out normally. Place the mouthpiece in your mouth and seal your lips tightly around it. Breathe in slowly and as deeply as possible, raising the piston or the ball toward the top of the column. Hold your breath for 3-5 seconds or for as long as possible. Allow the piston or ball to fall to the bottom of the column. Remove the mouthpiece from your mouth and breathe out normally. Rest for a few seconds and repeat Steps 1 through 7 at least 10 times every 1-2 hours when you are awake. Take  your time and take a few normal breaths between deep breaths. The spirometer may include an indicator to show your best effort. Use the indicator as a goal to work toward during each repetition. After each set of 10 deep breaths, practice coughing to be sure your lungs are clear. If you have an incision (the cut made at the time of surgery), support your incision when coughing by placing a pillow or rolled up towels firmly against it. Once you are able to get out of bed, walk around indoors and cough well. You may stop using the incentive spirometer when instructed by your caregiver.  RISKS AND COMPLICATIONS Take your time so you do not get dizzy or light-headed. If you are in pain, you may need to take or ask for pain medication before doing incentive spirometry. It is harder to take a deep breath if you are having pain. AFTER USE Rest and breathe slowly and easily. It can be helpful to keep track of a log of your progress. Your caregiver can provide you with a simple table to help with this. If you are using the spirometer at home, follow these instructions: SEEK MEDICAL CARE IF:  You are having difficultly using the spirometer. You have trouble using the spirometer as often as instructed. Your pain medication is not giving enough relief while using the spirometer. You develop fever of 100.5 F (38.1 C) or higher. SEEK IMMEDIATE MEDICAL  CARE IF:  You cough up bloody sputum that had not been present before. You develop fever of 102 F (38.9 C) or greater. You develop worsening pain at or near the incision site. MAKE SURE YOU:  Understand these instructions. Will watch your condition. Will get help right away if you are not doing well or get worse. Document Released: 05/26/2006 Document Revised: 04/07/2011 Document Reviewed: 07/27/2006 [facility] Patient Information 2014 [place], [place].   ________________________________________________________________________

## 2023-02-16 NOTE — Progress Notes (Signed)
Please place orders for PAT appointment scheduled 02/17/23.

## 2023-02-17 ENCOUNTER — Encounter (HOSPITAL_COMMUNITY)
Admission: RE | Admit: 2023-02-17 | Discharge: 2023-02-17 | Disposition: A | Discharge status: Home / Self Care | Payer: Medicare Other | Source: Ambulatory Visit | Attending: Orthopedic Surgery | Admitting: Orthopedic Surgery

## 2023-02-17 ENCOUNTER — Ambulatory Visit: Payer: Self-pay | Admitting: Emergency Medicine

## 2023-02-17 ENCOUNTER — Other Ambulatory Visit: Payer: Self-pay

## 2023-02-17 ENCOUNTER — Encounter (HOSPITAL_COMMUNITY): Payer: Self-pay

## 2023-02-17 VITALS — BP 128/74 | HR 77 | Temp 97.8°F | Resp 16 | Ht 67.0 in | Wt 198.0 lb

## 2023-02-17 DIAGNOSIS — G8929 Other chronic pain: Secondary | ICD-10-CM

## 2023-02-17 DIAGNOSIS — M25552 Pain in left hip: Secondary | ICD-10-CM | POA: Diagnosis not present

## 2023-02-17 DIAGNOSIS — E119 Type 2 diabetes mellitus without complications: Secondary | ICD-10-CM | POA: Diagnosis not present

## 2023-02-17 DIAGNOSIS — Z01812 Encounter for preprocedural laboratory examination: Secondary | ICD-10-CM | POA: Diagnosis present

## 2023-02-17 DIAGNOSIS — Z01818 Encounter for other preprocedural examination: Secondary | ICD-10-CM | POA: Diagnosis not present

## 2023-02-17 DIAGNOSIS — Z01810 Encounter for preprocedural cardiovascular examination: Secondary | ICD-10-CM | POA: Diagnosis present

## 2023-02-17 LAB — HEMOGLOBIN A1C
Hgb A1c MFr Bld: 7.1 % — ABNORMAL HIGH (ref 4.8–5.6)
Mean Plasma Glucose: 157.07 mg/dL

## 2023-02-17 LAB — CBC WITH DIFFERENTIAL/PLATELET
Abs Immature Granulocytes: 0.03 10*3/uL (ref 0.00–0.07)
Basophils Absolute: 0.1 10*3/uL (ref 0.0–0.1)
Basophils Relative: 1 %
Eosinophils Absolute: 0.1 10*3/uL (ref 0.0–0.5)
Eosinophils Relative: 2 %
HCT: 42.4 % (ref 36.0–46.0)
Hemoglobin: 13.8 g/dL (ref 12.0–15.0)
Immature Granulocytes: 0 %
Lymphocytes Relative: 26 %
Lymphs Abs: 2.1 10*3/uL (ref 0.7–4.0)
MCH: 30.6 pg (ref 26.0–34.0)
MCHC: 32.5 g/dL (ref 30.0–36.0)
MCV: 94 fL (ref 80.0–100.0)
Monocytes Absolute: 0.8 10*3/uL (ref 0.1–1.0)
Monocytes Relative: 10 %
Neutro Abs: 4.8 10*3/uL (ref 1.7–7.7)
Neutrophils Relative %: 61 %
Platelets: 301 10*3/uL (ref 150–400)
RBC: 4.51 MIL/uL (ref 3.87–5.11)
RDW: 12.7 % (ref 11.5–15.5)
WBC: 7.9 10*3/uL (ref 4.0–10.5)
nRBC: 0 % (ref 0.0–0.2)

## 2023-02-17 LAB — COMPREHENSIVE METABOLIC PANEL
ALT: 17 U/L (ref 0–44)
AST: 16 U/L (ref 15–41)
Albumin: 4.3 g/dL (ref 3.5–5.0)
Alkaline Phosphatase: 78 U/L (ref 38–126)
Anion gap: 8 (ref 5–15)
BUN: 18 mg/dL (ref 8–23)
CO2: 26 mmol/L (ref 22–32)
Calcium: 9.7 mg/dL (ref 8.9–10.3)
Chloride: 101 mmol/L (ref 98–111)
Creatinine, Ser: 1.1 mg/dL — ABNORMAL HIGH (ref 0.44–1.00)
GFR, Estimated: 54 mL/min — ABNORMAL LOW (ref >60)
Glucose, Bld: 150 mg/dL — ABNORMAL HIGH (ref 70–99)
Potassium: 3.8 mmol/L (ref 3.5–5.1)
Sodium: 135 mmol/L (ref 135–145)
Total Bilirubin: 0.5 mg/dL (ref 0.0–1.2)
Total Protein: 7.5 g/dL (ref 6.5–8.1)

## 2023-02-17 LAB — SURGICAL PCR SCREEN
MRSA, PCR: NEGATIVE
Staphylococcus aureus: NEGATIVE

## 2023-02-17 LAB — TYPE AND SCREEN
ABO/RH(D): AB POS
Antibody Screen: NEGATIVE

## 2023-02-17 LAB — GLUCOSE, CAPILLARY: Glucose-Capillary: 161 mg/dL — ABNORMAL HIGH (ref 70–99)
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
## 2023-02-26 NOTE — Care Plan (Signed)
Ortho Bundle Case Management Note  Patient Details  Name: Allison Mcdaniel MRN: [id number] Date of Birth: [birth year]   met with patient in the office for H&P. will discharge to home with family to assist. 3N1 ordered for home use. [facility] instructions discussed and questions answered. Patient and MD in agreement. Choice offered                   DME Arranged:  Bedside commode DME Agency:  [employer]  HH Arranged:    HH Agency:     Additional Comments: Please contact me with any questions of if this plan should need to change.  Shauna Hugh,  RN,BSN,MHA,CCM  [facility]  [phone] 02/26/2023, 8:56 AM

## 2023-03-01 NOTE — Anesthesia Preprocedure Evaluation (Signed)
Anesthesia Evaluation  Patient identified by MRN, date of birth, ID band Patient awake    Reviewed: Allergy & Precautions, NPO status , Patient's Chart, lab work & pertinent test results, reviewed documented beta blocker date and time   History of Anesthesia Complications (+) PONV and history of anesthetic complications  Airway Mallampati: II  TM Distance: >3 FB Neck ROM: Full    Dental no notable dental hx. (+) Dental Advisory Given   Pulmonary sleep apnea and Continuous Positive Airway Pressure Ventilation , neg recent URI   Pulmonary exam normal breath sounds clear to auscultation       Cardiovascular hypertension, Pt. on medications and Pt. on home beta blockers Normal cardiovascular exam Rhythm:Regular Rate:Normal  ECG: NSR, rate 71   Neuro/Psych  PSYCHIATRIC DISORDERS  Depression    negative neurological ROS     GI/Hepatic negative GI ROS,neg GERD  ,,NAFLD (nonalcoholic fatty liver disease)   Endo/Other  diabetes, Oral Hypoglycemic Agents    Renal/GU negative Renal ROS     Musculoskeletal  (+) Arthritis ,    Abdominal  (+) + obese  Peds  Hematology HLD   Anesthesia Other Findings   Reproductive/Obstetrics                             Anesthesia Physical Anesthesia Plan  ASA: 3  Anesthesia Plan: Spinal   Post-op Pain Management: Tylenol PO (pre-op)*   Induction: Intravenous  PONV Risk Score and Plan: 4 or greater and Ondansetron, Treatment may vary due to age or medical condition, Dexamethasone, Propofol infusion and TIVA  Airway Management Planned: Simple Face Mask  Additional Equipment:   Intra-op Plan:   Post-operative Plan:   Informed Consent: I have reviewed the patients History and Physical, chart, labs and discussed the procedure including the risks, benefits and alternatives for the proposed anesthesia with the patient or authorized representative who has  indicated his/her understanding and acceptance.     Dental advisory given  Plan Discussed with: CRNA  Anesthesia Plan Comments:         Anesthesia Quick Evaluation

## 2023-03-02 ENCOUNTER — Other Ambulatory Visit: Payer: Self-pay

## 2023-03-02 ENCOUNTER — Ambulatory Visit (HOSPITAL_COMMUNITY): Payer: Medicare Other

## 2023-03-02 ENCOUNTER — Ambulatory Visit (HOSPITAL_COMMUNITY): Payer: Medicare Other | Admitting: Physician Assistant

## 2023-03-02 ENCOUNTER — Encounter (HOSPITAL_COMMUNITY)
Admission: RE | Disposition: A | Discharge status: Home / Self Care | Payer: Self-pay | Source: Home / Self Care | Attending: Orthopedic Surgery

## 2023-03-02 ENCOUNTER — Ambulatory Visit (HOSPITAL_COMMUNITY): Payer: Self-pay | Admitting: Anesthesiology

## 2023-03-02 ENCOUNTER — Encounter (HOSPITAL_COMMUNITY): Payer: Self-pay | Admitting: Orthopedic Surgery

## 2023-03-02 ENCOUNTER — Ambulatory Visit (HOSPITAL_COMMUNITY)
Admission: RE | Admit: 2023-03-02 | Discharge: 2023-03-02 | Disposition: A | Discharge status: Home / Self Care | Payer: Medicare Other | Attending: Orthopedic Surgery | Admitting: Orthopedic Surgery

## 2023-03-02 DIAGNOSIS — K760 Fatty (change of) liver, not elsewhere classified: Secondary | ICD-10-CM | POA: Insufficient documentation

## 2023-03-02 DIAGNOSIS — Z79899 Other long term (current) drug therapy: Secondary | ICD-10-CM | POA: Insufficient documentation

## 2023-03-02 DIAGNOSIS — I10 Essential (primary) hypertension: Secondary | ICD-10-CM | POA: Diagnosis not present

## 2023-03-02 DIAGNOSIS — M1612 Unilateral primary osteoarthritis, left hip: Secondary | ICD-10-CM

## 2023-03-02 DIAGNOSIS — G4733 Obstructive sleep apnea (adult) (pediatric): Secondary | ICD-10-CM | POA: Diagnosis not present

## 2023-03-02 DIAGNOSIS — Z7984 Long term (current) use of oral hypoglycemic drugs: Secondary | ICD-10-CM | POA: Diagnosis not present

## 2023-03-02 DIAGNOSIS — E119 Type 2 diabetes mellitus without complications: Secondary | ICD-10-CM | POA: Insufficient documentation

## 2023-03-02 DIAGNOSIS — E785 Hyperlipidemia, unspecified: Secondary | ICD-10-CM | POA: Insufficient documentation

## 2023-03-02 DIAGNOSIS — Z01818 Encounter for other preprocedural examination: Secondary | ICD-10-CM

## 2023-03-02 HISTORY — PX: TOTAL HIP ARTHROPLASTY: SHX124

## 2023-03-02 LAB — GLUCOSE, CAPILLARY
Glucose-Capillary: 170 mg/dL — ABNORMAL HIGH (ref 70–99)
Glucose-Capillary: 140 mg/dL — ABNORMAL HIGH (ref 70–99)

## 2023-03-02 SURGERY — ARTHROPLASTY, HIP, TOTAL,POSTERIOR APPROACH
Anesthesia: Spinal | Site: Hip | Laterality: Left

## 2023-03-02 MED ORDER — ASPIRIN 81 MG PO TBEC: 81.0000 mg | DELAYED_RELEASE_TABLET | Freq: Two times a day (BID) | ORAL | Status: AC

## 2023-03-02 MED ORDER — KETOROLAC TROMETHAMINE 15 MG/ML IJ SOLN
INTRAMUSCULAR | Status: AC
  Filled 2023-03-02: qty 1

## 2023-03-02 MED ORDER — EPHEDRINE SULFATE (PRESSORS) 50 MG/ML IJ SOLN
INTRAMUSCULAR | Status: DC | PRN
  Administered 2023-03-02 (×5): 5 mg via INTRAVENOUS

## 2023-03-02 MED ORDER — FENTANYL CITRATE (PF) 100 MCG/2ML IJ SOLN
INTRAMUSCULAR | Status: DC | PRN
  Administered 2023-03-02: 50 ug via INTRAVENOUS

## 2023-03-02 MED ORDER — GLYCOPYRROLATE 0.2 MG/ML IJ SOLN
INTRAMUSCULAR | Status: AC
  Filled 2023-03-02: qty 1

## 2023-03-02 MED ORDER — POLYETHYLENE GLYCOL 3350 17 G PO PACK: 17.0000 g | PACK | Freq: Every day | ORAL | 0 refills | Status: AC

## 2023-03-02 MED ORDER — ACETAMINOPHEN 500 MG PO TABS
1000.0000 mg | ORAL_TABLET | Freq: Four times a day (QID) | ORAL | Status: DC
  Administered 2023-03-02: 1000 mg via ORAL

## 2023-03-02 MED ORDER — SODIUM CHLORIDE 0.9 % IR SOLN
Status: DC | PRN
  Administered 2023-03-02: 1000 mL

## 2023-03-02 MED ORDER — LACTATED RINGERS IV SOLN: INTRAVENOUS | Status: DC | PRN

## 2023-03-02 MED ORDER — OXYCODONE HCL 5 MG PO TABS
ORAL_TABLET | ORAL | Status: AC
  Filled 2023-03-02: qty 1

## 2023-03-02 MED ORDER — CEFAZOLIN SODIUM-DEXTROSE 2-4 GM/100ML-% IV SOLN
2.0000 g | INTRAVENOUS | Status: AC
  Administered 2023-03-02: 2 g via INTRAVENOUS
  Filled 2023-03-02: qty 100

## 2023-03-02 MED ORDER — PHENYLEPHRINE HCL (PRESSORS) 10 MG/ML IV SOLN
INTRAVENOUS | Status: AC
Start: 2023-03-02 — End: ?
  Filled 2023-03-02: qty 1

## 2023-03-02 MED ORDER — OXYCODONE HCL 5 MG PO TABS
5.0000 mg | ORAL_TABLET | ORAL | Status: DC | PRN
Start: 2023-03-02 — End: 2023-03-02
  Administered 2023-03-02: 5 mg via ORAL

## 2023-03-02 MED ORDER — FENTANYL CITRATE (PF) 100 MCG/2ML IJ SOLN
INTRAMUSCULAR | Status: AC
  Filled 2023-03-02: qty 2

## 2023-03-02 MED ORDER — DEXAMETHASONE SODIUM PHOSPHATE 10 MG/ML IJ SOLN
INTRAMUSCULAR | Status: AC
Start: 2023-03-02 — End: ?
  Filled 2023-03-02: qty 1

## 2023-03-02 MED ORDER — DEXMEDETOMIDINE HCL IN NACL 80 MCG/20ML IV SOLN
INTRAVENOUS | Status: DC | PRN
  Administered 2023-03-02: 8 ug via INTRAVENOUS

## 2023-03-02 MED ORDER — 0.9 % SODIUM CHLORIDE (POUR BTL) OPTIME
TOPICAL | Status: DC | PRN
  Administered 2023-03-02: 1000 mL

## 2023-03-02 MED ORDER — ONDANSETRON HCL 4 MG PO TABS: 4.0000 mg | ORAL_TABLET | Freq: Three times a day (TID) | ORAL | 0 refills | Status: AC | PRN

## 2023-03-02 MED ORDER — PHENYLEPHRINE HCL-NACL 20-0.9 MG/250ML-% IV SOLN
INTRAVENOUS | Status: AC
  Filled 2023-03-02: qty 250

## 2023-03-02 MED ORDER — PHENYLEPHRINE HCL-NACL 20-0.9 MG/250ML-% IV SOLN
INTRAVENOUS | Status: DC | PRN
  Administered 2023-03-02: 25 ug/min via INTRAVENOUS

## 2023-03-02 MED ORDER — ONDANSETRON HCL 4 MG/2ML IJ SOLN
INTRAMUSCULAR | Status: AC
  Filled 2023-03-02: qty 2

## 2023-03-02 MED ORDER — SODIUM CHLORIDE 0.9 % IV SOLN: INTRAVENOUS | Status: DC

## 2023-03-02 MED ORDER — KETOROLAC TROMETHAMINE 15 MG/ML IJ SOLN
7.5000 mg | Freq: Four times a day (QID) | INTRAMUSCULAR | Status: DC
  Administered 2023-03-02: 7.5 mg via INTRAVENOUS

## 2023-03-02 MED ORDER — POVIDONE-IODINE 10 % EX SWAB: 2.0000 | Freq: Once | CUTANEOUS | Status: DC

## 2023-03-02 MED ORDER — ACETAMINOPHEN 500 MG PO TABS
1000.0000 mg | ORAL_TABLET | Freq: Once | ORAL | Status: AC
  Administered 2023-03-02: 1000 mg via ORAL
  Filled 2023-03-02: qty 2

## 2023-03-02 MED ORDER — INSULIN ASPART 100 UNIT/ML IJ SOLN
0.0000 [IU] | INTRAMUSCULAR | Status: DC | PRN
  Administered 2023-03-02: 2 [IU] via SUBCUTANEOUS
  Filled 2023-03-02: qty 1

## 2023-03-02 MED ORDER — ISOPROPYL ALCOHOL 70 % SOLN
Status: DC | PRN
  Administered 2023-03-02: 1 via TOPICAL

## 2023-03-02 MED ORDER — ACETAMINOPHEN 500 MG PO TABS: 1000.0000 mg | ORAL_TABLET | Freq: Three times a day (TID) | ORAL | Status: AC | PRN

## 2023-03-02 MED ORDER — ONDANSETRON HCL 4 MG/2ML IJ SOLN
INTRAMUSCULAR | Status: DC | PRN
  Administered 2023-03-02: 4 mg via INTRAVENOUS

## 2023-03-02 MED ORDER — CHLORHEXIDINE GLUCONATE 0.12 % MT SOLN
15.0000 mL | Freq: Once | OROMUCOSAL | Status: AC
  Administered 2023-03-02: 15 mL via OROMUCOSAL

## 2023-03-02 MED ORDER — ALBUMIN HUMAN 5 % IV SOLN: INTRAVENOUS | Status: DC | PRN

## 2023-03-02 MED ORDER — HYDROMORPHONE HCL 1 MG/ML IJ SOLN
0.2500 mg | INTRAMUSCULAR | Status: DC | PRN
  Administered 2023-03-02: 0.5 mg via INTRAVENOUS

## 2023-03-02 MED ORDER — LACTATED RINGERS IV BOLUS
500.0000 mL | Freq: Once | INTRAVENOUS | Status: AC
  Administered 2023-03-02: 500 mL via INTRAVENOUS

## 2023-03-02 MED ORDER — GLYCOPYRROLATE 0.2 MG/ML IJ SOLN
INTRAMUSCULAR | Status: DC | PRN
  Administered 2023-03-02: .2 mg via INTRAVENOUS

## 2023-03-02 MED ORDER — BUPIVACAINE LIPOSOME 1.3 % IJ SUSP
INTRAMUSCULAR | Status: AC
  Filled 2023-03-02: qty 20

## 2023-03-02 MED ORDER — BUPIVACAINE IN DEXTROSE 0.75-8.25 % IT SOLN
INTRATHECAL | Status: DC | PRN
  Administered 2023-03-02: 1.8 mL via INTRATHECAL

## 2023-03-02 MED ORDER — MELOXICAM 15 MG PO TABS
15.0000 mg | ORAL_TABLET | Freq: Every day | ORAL | 0 refills | Status: AC
Start: 2023-03-02 — End: 2023-03-17

## 2023-03-02 MED ORDER — LACTATED RINGERS IV SOLN: INTRAVENOUS | Status: DC

## 2023-03-02 MED ORDER — PROPOFOL 500 MG/50ML IV EMUL
INTRAVENOUS | Status: DC | PRN
  Administered 2023-03-02 (×2): 30 mg via INTRAVENOUS
  Administered 2023-03-02: 100 ug/kg/min via INTRAVENOUS

## 2023-03-02 MED ORDER — BUPIVACAINE-EPINEPHRINE 0.25% -1:200000 IJ SOLN
INTRAMUSCULAR | Status: AC
  Filled 2023-03-02: qty 1

## 2023-03-02 MED ORDER — DEXAMETHASONE SODIUM PHOSPHATE 4 MG/ML IJ SOLN
4.0000 mg | Freq: Once | INTRAMUSCULAR | Status: AC
  Administered 2023-03-02: 5 mg via INTRAVENOUS

## 2023-03-02 MED ORDER — ORAL CARE MOUTH RINSE: 15.0000 mL | Freq: Once | OROMUCOSAL | Status: AC

## 2023-03-02 MED ORDER — METHOCARBAMOL 500 MG PO TABS: 500.0000 mg | ORAL_TABLET | Freq: Three times a day (TID) | ORAL | 0 refills | Status: AC | PRN

## 2023-03-02 MED ORDER — TRANEXAMIC ACID-NACL 1000-0.7 MG/100ML-% IV SOLN
1000.0000 mg | INTRAVENOUS | Status: AC
  Administered 2023-03-02: 1000 mg via INTRAVENOUS
  Filled 2023-03-02: qty 100

## 2023-03-02 MED ORDER — SODIUM CHLORIDE (PF) 0.9 % IJ SOLN
INTRAMUSCULAR | Status: DC | PRN
  Administered 2023-03-02: 80 mL

## 2023-03-02 MED ORDER — DROPERIDOL 2.5 MG/ML IJ SOLN: 0.6250 mg | Freq: Once | INTRAMUSCULAR | Status: DC | PRN

## 2023-03-02 MED ORDER — OXYCODONE HCL 5 MG PO TABS: 5.0000 mg | ORAL_TABLET | ORAL | 0 refills | Status: AC | PRN

## 2023-03-02 MED ORDER — WATER FOR IRRIGATION, STERILE IR SOLN
Status: DC | PRN
  Administered 2023-03-02: 1000 mL

## 2023-03-02 MED ORDER — SODIUM CHLORIDE (PF) 0.9 % IJ SOLN
INTRAMUSCULAR | Status: AC
Start: 2023-03-02 — End: ?
  Filled 2023-03-02: qty 30

## 2023-03-02 MED ORDER — ACETAMINOPHEN 500 MG PO TABS
ORAL_TABLET | ORAL | Status: AC
  Filled 2023-03-02: qty 2

## 2023-03-02 MED ORDER — HYDROMORPHONE HCL 1 MG/ML IJ SOLN
INTRAMUSCULAR | Status: AC
  Filled 2023-03-02: qty 1

## 2023-03-02 MED ORDER — BUPIVACAINE LIPOSOME 1.3 % IJ SUSP: 10.0000 mL | Freq: Once | INTRAMUSCULAR | Status: DC

## 2023-03-02 SURGICAL SUPPLY — 70 items
BAG COUNTER SPONGE SURGICOUNT (BAG) IMPLANT
BAG ZIPLOCK 12X15 (MISCELLANEOUS) ×2 IMPLANT
BIT DRILL TRIDENT 4X25 SU (BIT) IMPLANT
BLADE SAW SAG 25X90X1.19 (BLADE) ×2 IMPLANT
CEMENT BONE SIMPLEX SPEEDSET (Cement) IMPLANT
CHLORAPREP W/TINT 26 (MISCELLANEOUS) ×4 IMPLANT
CNTNR URN SCR LID CUP LEK RST (MISCELLANEOUS) ×2 IMPLANT
COVER SURGICAL LIGHT HANDLE (MISCELLANEOUS) ×2 IMPLANT
DERMABOND ADVANCED .7 DNX12 (GAUZE/BANDAGES/DRESSINGS) ×2 IMPLANT
DRAPE HIP W/POCKET STRL (MISCELLANEOUS) ×2 IMPLANT
DRAPE INCISE IOBAN 66X45 STRL (DRAPES) ×2 IMPLANT
DRAPE INCISE IOBAN 85X60 (DRAPES) ×2 IMPLANT
DRAPE POUCH INSTRU U-SHP 10X18 (DRAPES) ×2 IMPLANT
DRAPE SHEET LG 3/4 BI-LAMINATE (DRAPES) ×6 IMPLANT
DRAPE U-SHAPE 47X51 STRL (DRAPES) ×4 IMPLANT
DRSG AQUACEL AG ADV 3.5X10 (GAUZE/BANDAGES/DRESSINGS) ×2 IMPLANT
ELECT BLADE TIP CTD 4 INCH (ELECTRODE) ×2 IMPLANT
ELECT REM PT RETURN 15FT ADLT (MISCELLANEOUS) ×2 IMPLANT
GAUZE SPONGE 4X4 12PLY STRL (GAUZE/BANDAGES/DRESSINGS) ×2 IMPLANT
GLOVE BIO SURGEON STRL SZ 6.5 (GLOVE) ×4 IMPLANT
GLOVE BIOGEL PI IND STRL 6.5 (GLOVE) ×2 IMPLANT
GLOVE BIOGEL PI IND STRL 8 (GLOVE) ×2 IMPLANT
GLOVE SURG ORTHO 8.0 STRL STRW (GLOVE) ×4 IMPLANT
GOWN STRL REUS W/ TWL XL LVL3 (GOWN DISPOSABLE) ×4 IMPLANT
HEAD BIOLOX HIP 36/-2.5 (Joint) IMPLANT
HIP BIOLOX HD 36/-2.5 (Joint) ×1 IMPLANT
HOLDER FOLEY CATH W/STRAP (MISCELLANEOUS) ×2 IMPLANT
HOOD PEEL AWAY T7 (MISCELLANEOUS) ×6 IMPLANT
INSERT TRIDENT POLY 36 0DEG (Insert) IMPLANT
KIT BASIN OR (CUSTOM PROCEDURE TRAY) ×2 IMPLANT
KIT TURNOVER KIT A (KITS) IMPLANT
MANIFOLD NEPTUNE II (INSTRUMENTS) ×2 IMPLANT
MARKER SKIN DUAL TIP RULER LAB (MISCELLANEOUS) ×2 IMPLANT
NDL MA TROC 1/2 CIR (NEEDLE) IMPLANT
NDL SAFETY ECLIPSE 18X1.5 (NEEDLE) ×4 IMPLANT
NEEDLE MA TROC 1/2 CIR (NEEDLE) ×1
NS IRRIG 1000ML POUR BTL (IV SOLUTION) ×2 IMPLANT
PACK TOTAL JOINT (CUSTOM PROCEDURE TRAY) ×2 IMPLANT
PAD ARMBOARD 7.5X6 YLW CONV (MISCELLANEOUS) ×2 IMPLANT
PRESSURIZER FEMORAL UNIV (MISCELLANEOUS) IMPLANT
PROTECTOR NERVE ULNAR (MISCELLANEOUS) IMPLANT
RETRIEVER SUT HEWSON (MISCELLANEOUS) ×2 IMPLANT
SCREW HEX LP 6.5X20 (Screw) IMPLANT
SCREW HEX LP 6.5X25 (Screw) IMPLANT
SEALER BIPOLAR AQUA 6.0 (INSTRUMENTS) IMPLANT
SET HNDPC FAN SPRY TIP SCT (DISPOSABLE) IMPLANT
SHELL CLUSTERHOLE ACETABULAR 5 (Shell) IMPLANT
SHELL GRIPTION 100 48MM (Shell) IMPLANT
SHIELD FACE FULL FLUID (MISCELLANEOUS) ×2 IMPLANT
SOLUTION IRRIG SURGIPHOR (IV SOLUTION) IMPLANT
SPIKE FLUID TRANSFER (MISCELLANEOUS) ×2 IMPLANT
STEM HIP 4 127DEG (Stem) IMPLANT
SUCTION TUBE FRAZIER 12FR DISP (SUCTIONS) ×2 IMPLANT
SUT BONE WAX W31G (SUTURE) ×2 IMPLANT
SUT ETHIBOND #5 BRAIDED 30INL (SUTURE) ×2 IMPLANT
SUT MNCRL AB 3-0 PS2 18 (SUTURE) ×2 IMPLANT
SUT STRATAFIX 0 PDS 27 VIOLET (SUTURE) ×1
SUT STRATAFIX 14 PDO 48 VLT (SUTURE) ×2 IMPLANT
SUT STRATAFIX PDO 1 14 VIOLET (SUTURE) ×1
SUT VIC AB 1 CT1 36 (SUTURE) IMPLANT
SUT VIC AB 2-0 CT2 27 (SUTURE) ×4 IMPLANT
SUTURE STRATFX 0 PDS 27 VIOLET (SUTURE) ×2 IMPLANT
SYR 30ML LL (SYRINGE) ×2 IMPLANT
SYR 50ML LL SCALE MARK (SYRINGE) ×2 IMPLANT
TOWEL OR 17X26 10 PK STRL BLUE (TOWEL DISPOSABLE) ×2 IMPLANT
TOWER CARTRIDGE SMART MIX (DISPOSABLE) IMPLANT
TRAY FOLEY MTR SLVR 16FR STAT (SET/KITS/TRAYS/PACK) IMPLANT
TUBE SUCTION HIGH CAP CLEAR NV (SUCTIONS) ×2 IMPLANT
UNDERPAD 30X36 HEAVY ABSORB (UNDERPADS AND DIAPERS) ×2 IMPLANT
WATER STERILE IRR 1000ML POUR (IV SOLUTION) ×4 IMPLANT

## 2023-03-02 NOTE — Interval H&P Note (Signed)
The patient has been re-examined, and the chart reviewed, and there have been no interval changes to the documented history and physical.    Plan for L THA for Left hip OA  The operative side was examined and the patient was confirmed to have sensation to DPN, SPN, TN intact, Motor EHL, ext, flex 5/5, and DP 2+, PT 2+, No significant edema.   The risks, benefits, and alternatives have been discussed at length with patient, and the patient is willing to proceed.  Left hip marked. Consent has been signed.

## 2023-03-02 NOTE — Anesthesia Procedure Notes (Addendum)
Spinal  Patient location during procedure: OR Start time: 03/02/2023 7:30 AM End time: 03/02/2023 7:35 AM Reason for block: surgical anesthesia Staffing Performed: anesthesiologist  Anesthesiologist: Lewie Loron, MD Performed by: Lewie Loron, MD Authorized by: Lewie Loron, MD   Preanesthetic Checklist Completed: patient identified, IV checked, site marked, risks and benefits discussed, surgical consent, monitors and equipment checked, pre-op evaluation and timeout performed Spinal Block Patient position: sitting Prep: DuraPrep and site prepped and draped Patient monitoring: heart rate, continuous pulse ox and blood pressure Approach: right paramedian Location: L2-3 Injection technique: single-shot Needle Needle type: Spinocan  Needle gauge: 25 G Needle length: 9 cm Additional Notes Expiration date of kit checked and confirmed. Patient tolerated procedure well, without complications.

## 2023-03-02 NOTE — Op Note (Signed)
03/02/2023  9:07 AM  PATIENT:  Allison Mcdaniel   MRN: [id number]  PRE-OPERATIVE DIAGNOSIS: End-stage left hip osteoarthritis  POST-OPERATIVE DIAGNOSIS:  same  PROCEDURE: Left TOTAL HIP ARTHROPLASTY  PREOPERATIVE INDICATIONS:    Allison Mcdaniel is an 68 y.o. female who has a diagnosis of End-stage left hip osteoarthritis and elected for surgical management after failing conservative treatment.  The risks benefits and alternatives were discussed with the patient including but not limited to the risks of nonoperative treatment, versus surgical intervention including infection, bleeding, nerve injury, periprosthetic fracture, the need for revision surgery, dislocation, leg length discrepancy, blood clots, cardiopulmonary complications, morbidity, mortality, among others, and they were willing to proceed.     OPERATIVE REPORT     SURGEON:  Weber Cooks, MD    ASSISTANT: Kathie Dike, PA-C, (Present throughout the entire procedure,  necessary for completion of procedure in a timely manner, assisting with retraction, instrumentation, and closure)     ANESTHESIA: Spinal  ESTIMATED BLOOD LOSS: 300cc    COMPLICATIONS:  None.     COMPONENTS:   Stryker Trident 250 mm acetabular shell, 6.5 hex screws x 2, neutral X.3 polyethylene liner, Accolade 2 size #4 stem with 127 degree neck angle, 36-2.5 mm ceramic head Implant Name Type Inv. Item Serial No. Manufacturer Lot No. LRB No. Used Action  SHELL CLUSTERHOLE ACETABULAR 5 - [id number] Shell SHELL CLUSTERHOLE ACETABULAR 5  [employer] [id number] Left 1 Implanted  SCREW HEX LP 6.5X20 - [id number] Screw SCREW HEX LP 6.5X20  [employer] [id number] Left 1 Implanted  SCREW HEX LP 6.5X25 - [id number] Screw SCREW HEX LP 6.5X25  [employer] [id number] Left 1 Implanted  INSERT TRIDENT POLY 36 0DEG - [id number] Insert INSERT TRIDENT POLY 36 0DEG  [employer] [id number] Left 1 Implanted  STEM HIP 4 127DEG - [id number] Stem STEM HIP 4  127DEG  [employer] [id number] A Left 1 Implanted  HIP BIOLOX HD 36/-2.5 - [id number] Joint HIP BIOLOX HD 36/-2.5  [employer] [id number] Left 1 Implanted        PROCEDURE IN DETAIL:   The patient was met in the holding area and  identified.  The appropriate hip was identified and marked at the operative site.  The patient was then transported to the OR  and  placed under anesthesia.  At that point, the patient was  placed in the lateral decubitus position with the operative side up and  secured to the operating room table  and all bony prominences padded. A subaxillary role was also placed.    The operative lower extremity was prepped from the iliac crest to the distal leg.  Sterile draping was performed.  Preoperative antibiotics, 2 gm of ancef,1 gm of Tranexamic Acid, and 8 mg of Decadron administered. Time out was performed prior to incision.      A routine posterolateral approach was utilized via sharp dissection  carried down to the subcutaneous tissue.  Gross bleeders were Bovie coagulated.  The iliotibial band was identified and incised along the length of the skin incision through the glute max fascia.  Charnley retractor was placed with care to protect the sciatic nerve posteriorly.  With the hip internally rotated, the piriformis tendon was identified and released from the femoral insertion and tagged with a #5 Ethibond.  A capsulotomy was then performed off the femoral insertion and also tagged with a #5 Ethibond.    The femoral neck was exposed, and I resected the femoral neck based on preoperative templating relative to  the lesser trochanter.    I then exposed the deep acetabulum, cleared out any tissue including the ligamentum teres.  After adequate visualization, I excised the labrum.  I then started reaming with a 48 mm reamer, first medializing to the floor of the cotyloid fossa, and then in the position of the cup aiming towards the greater sciatic notch, matching the  version of the transverse acetabular ligament and tucked under the anterior wall. I reamed up to 52 mm reamer with good bony bed preparation and a 52 mm cup was chosen.  The real cup was then impacted into place.  Appropriate version and inclination was confirmed clinically matching their bony anatomy, and also with the use of the jig.  I placed 2 screws in the posterior superior quadrant to augment fixation.  A neutral liner was placed and impacted. It was confirmed to be appropriately seated and the acetabular retractors were removed.    I then prepared the proximal femur using the box cutter, Charnley awl, and then sequentially broached starting with 0 up to a size 4.  A trial broach, neck, and head was utilized, and I reduced the hip and it was found to have excellent stability.  There was no impingement with full extension and 90 degrees external rotation.  The hip was stable at the position of sleep and with 90 degrees flexion and 80 degrees of internal rotation.  Leg lengths were also clinically assessed in the lateral position and felt to be equal. Intra-Op flatplate was obtained and confirmed appropriate component positions.  Good fill of the femur with the size 4 broach.  And restoration of leg length and offset. No evidence or concern for fracture.  A final femoral prosthesis size 4 was selected. I then impacted the real femoral prosthesis into place.I again trialed and selected a 36 -2.68mm ball. The hip was then reduced and taken through a range of motion. There was no impingement with full extension and 90 degrees external rotation.  The hip was stable at the position of sleep and with 90 degrees flexion and 80 degrees of internal rotation. Leg lengths were  again assessed and felt to be restored.  We then opened, and I impacted the real head ball into place.  The posterior capsule was then closed with #5 Ethibond.  The piriformis was repaired through the base of the abductor tendon using a  [place] suture passer.  I then irrigated the hip copiously with dilute Betadine and with normal saline pulse lavage. Periarticular injection was then performed with Exparel.   We repaired the fascia #1 barbed suture, followed by 0 barbed suture for the subcutaneous fat.  Skin was closed with 2-0 Vicryl and 3-0 Monocryl.  Dermabond and Aquacel dressing were applied. The patient was then awakened and returned to PACU in stable and satisfactory condition.  Leg lengths in the supine position were assessed and felt to be clinically equal. There were no complications.  Post op recs: WB: WBAT LLE, No formal hip precautions Abx: ancef Imaging: PACU pelvis Xray Dressing: Aquacell, keep intact until follow up DVT prophylaxis: Eliquis 2.5mg  BID x4 weeks starting POD1 Follow up: 2 weeks after surgery for a wound check with Dr. Blanchie Dessert at [facility]  Address: [address], [place], [place] [address]  Office Phone: [phone]   Weber Cooks, MD Orthopedic Surgeon

## 2023-03-02 NOTE — Transfer of Care (Signed)
Immediate Anesthesia Transfer of Care Note  Patient: Allison Mcdaniel  Procedure(s) Performed: TOTAL HIP ARTHROPLASTY (Left: Hip)  Patient Location: PACU  Anesthesia Type:MAC combined with regional for post-op pain  Level of Consciousness: awake and alert   Airway & Oxygen Therapy: Patient Spontanous Breathing and Patient connected to nasal cannula oxygen  Post-op Assessment: Report given to RN and Post -op Vital signs reviewed and stable  Post vital signs: Reviewed and stable  Last Vitals:  Vitals Value Taken Time  BP 123/68 03/02/23 0943  Temp    Pulse 67 03/02/23 0944  Resp 22 03/02/23 0944  SpO2 100 % 03/02/23 0944  Vitals shown include unfiled device data.  Last Pain:  Vitals:   03/02/23 0601  TempSrc: Oral         Complications: No notable events documented.

## 2023-03-02 NOTE — Evaluation (Signed)
Physical Therapy Evaluation Patient Details Name: Allison Mcdaniel MRN: [id number] DOB: 1956-01-19 Today's Date: 03/02/2023  History of Present Illness  68 yo female presents to therapy s/p L THA, posterior lateral approach on 03/02/2023 due to failure of conservative measures. Pt is currently LLE WBAT and no formal hip precautions. 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  Clinical Impression   Allison Mcdaniel is a 67 y.o. female POD 0 s/p L THA. Patient reports IND with occasional use of SPC with mobility at baseline. Patient is now limited by functional impairments (see PT problem list below) and requires CGA and cues for transfers and gait with RW. Patient was able to ambulate 45 feet with RW and CGA and cues for safe walker management. Patient educated on safe sequencing for stair mobility, fall risk prevention, use of RW, pain management and goal, use of CP/ice and car transfers pt and friend verbalized safe guarding position for people assisting with mobility. Patient instructed in exercises to facilitate ROM and circulation. Patient will benefit from continued skilled PT interventions to address impairments and progress towards PLOF. Patient has met mobility goals at adequate level for discharge home with social support and OPPT services scheduled for 2/23; will continue to follow if pt continues acute stay to progress towards Mod I goals.        If plan is discharge home, recommend the following: A little help with walking and/or transfers;A little help with bathing/dressing/bathroom;Assistance with cooking/housework;Assist for transportation;Help with stairs or ramp for entrance   Can travel by private vehicle        Equipment Recommendations None recommended by PT  Recommendations for Other Services       Functional Status Assessment Patient has  had a recent decline in their functional status and demonstrates the ability to make significant improvements in function in a reasonable and predictable amount of time.     Precautions / Restrictions Precautions Precautions: Fall Restrictions Weight Bearing Restrictions Per Provider Order: No      Mobility  Bed Mobility Overal bed mobility: Needs Assistance Bed Mobility: Supine to Sit     Supine to sit: Supervision, HOB elevated     General bed mobility comments: min cues    Transfers Overall transfer level: Needs assistance Equipment used: Rolling walker (2 wheels) Transfers: Sit to/from Stand Sit to Stand: Contact guard assist           General transfer comment: min cues    Ambulation/Gait Ambulation/Gait assistance: Contact guard assist Gait Distance (Feet): 45 Feet Assistive device: Rolling walker (2 wheels) Gait Pattern/deviations: Step-to pattern, Antalgic, Trunk flexed Gait velocity: decreased     General Gait Details: slight trunk flexion with B UE support at RW to offload L LE in stance phase, min cues  Stairs Stairs: Yes Stairs assistance: Contact guard assist Stair Management: Two rails Number of Stairs: 2 General stair comments: min cues for safety and sequencing, pt reports limited R knee flexion impacing step navigation PLOF  Wheelchair Mobility     Tilt Bed    Modified Rankin (Stroke Patients Only)       Balance Overall balance assessment: Needs assistance Sitting-balance support: Feet supported Sitting balance-Leahy Scale: Good     Standing balance support: Bilateral upper extremity supported, During functional activity, Reliant on assistive device for balance Standing balance-Leahy Scale: Fair Standing balance comment:  static standing no UE support                             Pertinent Vitals/Pain Pain Assessment Pain Assessment: 0-10 Pain Score: 4  Pain Location: L hip and knee Pain Descriptors / Indicators:  Aching, Constant, Discomfort, Dull, Grimacing, Operative site guarding Pain Intervention(s): Limited activity within patient's tolerance, Monitored during session, Premedicated before session, Repositioned, Ice applied    Home Living Family/patient expects to be discharged to:: Private residence Living Arrangements: Alone Available Help at Discharge: Family;Friend(s) Type of Home: House Home Access: Stairs to enter Entrance Stairs-Rails: Right;Left;Can reach both Entrance Stairs-Number of Steps: 2   Home Layout: One level Home Equipment: Agricultural consultant (2 wheels);Cane - single point;Tub bench      Prior Function Prior Level of Function : Independent/Modified Independent;Driving             Mobility Comments: occational use of SPC for stability, mod I for all ADLs, self care tasks and IADLs       Extremity/Trunk Assessment        Lower Extremity Assessment Lower Extremity Assessment: LLE deficits/detail LLE Deficits / Details: ankle DF/PF 5/5 LLE Sensation: WNL    Cervical / Trunk Assessment Cervical / Trunk Assessment: Normal  Communication   Communication Communication: No apparent difficulties  Cognition Arousal: Alert Behavior During Therapy: WFL for tasks assessed/performed Overall Cognitive Status: Within Functional Limits for tasks assessed                                          General Comments      Exercises Total Joint Exercises Ankle Circles/Pumps: AROM, Both, 10 reps Quad Sets: AROM, Left, 5 reps Towel Squeeze: AROM Heel Slides: AROM, Right, 5 reps Hip ABduction/ADduction: AROM, Left, 5 reps, Standing Long Arc Quad: AROM, Left, 5 reps, Seated Knee Flexion: AROM, Left, 5 reps, Standing Standing Hip Extension: AROM, Left, 5 reps   Assessment/Plan    PT Assessment Patient needs continued PT services  PT Problem List Decreased strength;Decreased activity tolerance;Decreased range of motion;Decreased balance;Decreased  mobility;Decreased coordination;Pain       PT Treatment Interventions DME instruction;Gait training;Stair training;Functional mobility training;Therapeutic activities;Balance training;Therapeutic exercise;Neuromuscular re-education;Patient/family education;Modalities    PT Goals (Current goals can be found in the Care Plan section)  Acute Rehab PT Goals Patient Stated Goal: to be able to go for a walk in the woods with the dogs and return to pliates classes PT Goal Formulation: With patient Time For Goal Achievement: 03/16/23 Potential to Achieve Goals: Good    Frequency 7X/week     Co-evaluation               AM-PAC PT "6 Clicks" Mobility  Outcome Measure Help needed turning from your back to your side while in a flat bed without using bedrails?: None Help needed moving from lying on your back to sitting on the side of a flat bed without using bedrails?: None Help needed moving to and from a bed to a chair (including a wheelchair)?: A Little Help needed standing up from a chair using your arms (e.g., wheelchair or bedside chair)?: A Little Help needed to walk in hospital room?: A Little Help needed climbing 3-5 steps with a railing? : A Little 6 Click Score: 20    End of Session Equipment Utilized During Treatment: Gait belt  Activity Tolerance: Patient tolerated treatment well Patient left: in chair;with call bell/phone within reach;with family/visitor present Nurse Communication: Mobility status;Other (comment) (pt readiness for same day d/c from PT standpoint) PT Visit Diagnosis: Unsteadiness on feet (R26.81);Other abnormalities of gait and mobility (R26.89);Muscle weakness (generalized) (M62.81);Difficulty in walking, not elsewhere classified (R26.2);Pain Pain - Right/Left: Left Pain - part of body: Hip;Knee;Leg    Time: 1342-1420 PT Time Calculation (min) (ACUTE ONLY): 38 min   Charges:   PT Evaluation $PT Eval Low Complexity: 1 Low PT Treatments $Gait  Training: 8-22 mins $Therapeutic Exercise: 8-22 mins PT General Charges $$ ACUTE PT VISIT: 1 Visit         Johnny Bridge, PT Acute Rehab   Jacqualyn Posey 03/02/2023, 2:46 PM

## 2023-03-02 NOTE — Discharge Instructions (Signed)
 INSTRUCTIONS AFTER JOINT REPLACEMENT   Remove items at home which could result in a fall. This includes throw rugs or furniture in walking pathways ICE to the affected joint every three hours while awake for 30 minutes at a time, for at least the first 3-5 days, and then as needed for pain and swelling.  Continue to use ice for pain and swelling. You may notice swelling that will progress down to the foot and ankle.  This is normal after surgery.  Elevate your leg when you are not up walking on it.   Continue to use the breathing machine you got in the hospital (incentive spirometer) which will help keep your temperature down.  It is common for your temperature to cycle up and down following surgery, especially at night when you are not up moving around and exerting yourself.  The breathing machine keeps your lungs expanded and your temperature down.  DIET:  As you were doing prior to hospitalization, we recommend a well-balanced diet.  DRESSING / WOUND CARE / SHOWERING:  Keep the surgical dressing until follow up.  The dressing is water proof, so you can shower without any extra covering.  IF THE DRESSING FALLS OFF or the wound gets wet inside, change the dressing with sterile gauze.  Please use good hand washing techniques before changing the dressing.  Do not use any lotions or creams on the incision until instructed by your surgeon.    ACTIVITY  Increase activity slowly as tolerated, but follow the weight bearing instructions below.   No driving for 6 weeks or until further direction given by your physician.  You cannot drive while taking narcotics.  No lifting or carrying greater than 10 lbs. until further directed by your surgeon. Avoid periods of inactivity such as sitting longer than an hour when not asleep. This helps prevent blood clots.  You may return to work once you are authorized by your doctor.   WEIGHT BEARING: Weight bearing as tolerated with assist device (walker, cane, etc) as  directed, use it as long as suggested by your surgeon or therapist, typically at least 4-6 weeks.  EXERCISES  Results after joint replacement surgery are often greatly improved when you follow the exercise, range of motion and muscle strengthening exercises prescribed by your doctor. Safety measures are also important to protect the joint from further injury. Any time any of these exercises cause you to have increased pain or swelling, decrease what you are doing until you are comfortable again and then slowly increase them. If you have problems or questions, call your caregiver or physical therapist for advice.   Rehabilitation is important following a joint replacement. After just a few days of immobilization, the muscles of the leg can become weakened and shrink (atrophy).  These exercises are designed to build up the tone and strength of the thigh and leg muscles and to improve motion. Often times heat used for twenty to thirty minutes before working out will loosen up your tissues and help with improving the range of motion but do not use heat for the first two weeks following surgery (sometimes heat can increase post-operative swelling).   These exercises can be done on a training (exercise) mat, on the floor, on a table or on a bed. Use whatever works the best and is most comfortable for you.    Use music or television while you are exercising so that the exercises are a pleasant break in your day. This will make your life  better with the exercises acting as a break in your routine that you can look forward to.   Perform all exercises about fifteen times, three times per day or as directed.  You should exercise both the operative leg and the other leg as well.  Exercises include:   Quad Sets - Tighten up the muscle on the front of the thigh (Quad) and hold for 5-10 seconds.   Straight Leg Raises - With your knee straight (if you were given a brace, keep it on), lift the leg to 60 degrees, hold  for 3 seconds, and slowly lower the leg.  Perform this exercise against resistance later as your leg gets stronger.  Leg Slides: Lying on your back, slowly slide your foot toward your buttocks, bending your knee up off the floor (only go as far as is comfortable). Then slowly slide your foot back down until your leg is flat on the floor again.  Angel Wings: Lying on your back spread your legs to the side as far apart as you can without causing discomfort.  Hamstring Strength:  Lying on your back, push your heel against the floor with your leg straight by tightening up the muscles of your buttocks.  Repeat, but this time bend your knee to a comfortable angle, and push your heel against the floor.  You may put a pillow under the heel to make it more comfortable if necessary.   A rehabilitation program following joint replacement surgery can speed recovery and prevent re-injury in the future due to weakened muscles. Contact your doctor or a physical therapist for more information on knee rehabilitation.   CONSTIPATION:  Constipation is defined medically as fewer than three stools per week and severe constipation as less than one stool per week.  Even if you have a regular bowel pattern at home, your normal regimen is likely to be disrupted due to multiple reasons following surgery.  Combination of anesthesia, postoperative narcotics, change in appetite and fluid intake all can affect your bowels.   YOU MUST use at least one of the following options; they are listed in order of increasing strength to get the job done.  They are all available over the counter, and you may need to use some, POSSIBLY even all of these options:    Drink plenty of fluids (prune juice may be helpful) and high fiber foods Colace 100 mg by mouth twice a day  Senokot for constipation as directed and as needed Dulcolax (bisacodyl), take with full glass of water  Miralax (polyethylene glycol) once or twice a day as needed.  If you  have tried all these things and are unable to have a bowel movement in the first 3-4 days after surgery call either your surgeon or your primary doctor.    If you experience loose stools or diarrhea, hold the medications until you stool forms back up.  If your symptoms do not get better within 1 week or if they get worse, check with your doctor.  If you experience "the worst abdominal pain ever" or develop nausea or vomiting, please contact the office immediately for further recommendations for treatment.  ITCHING:  If you experience itching with your medications, try taking only a single pain pill, or even half a pain pill at a time.  You can also use Benadryl over the counter for itching or also to help with sleep.   TED HOSE STOCKINGS:  Use stockings on both legs until for at least 2 weeks or  as directed by physician office. They may be removed at night for sleeping.  MEDICATIONS:  See your medication summary on the "After Visit Summary" that nursing will review with you.  You may have some home medications which will be placed on hold until you complete the course of blood thinner medication.  It is important for you to complete the blood thinner medication as prescribed.  Blood clot prevention (DVT Prophylaxis): After surgery you are at an increased risk for a blood clot. you were prescribed a blood thinner, Aspirin 81mg , to be taken twice daily for a total of 4 weeks from surgery to help reduce your risk of getting a blood clot.  Signs of a pulmonary embolus (blood clot in the lungs) include sudden short of breath, feeling lightheaded or dizzy, chest pain with a deep breath, rapid pulse rapid breathing.  Signs of a blood clot in your arms or legs include new unexplained swelling and cramping, warm, red or darkened skin around the painful area.  Please call the office or 911 right away if these signs or symptoms develop.  PRECAUTIONS:   If you experience chest pain or shortness of breath - call 911  immediately for transfer to the hospital emergency department.   If you develop a fever greater that 101 F, purulent drainage from wound, increased redness or drainage from wound, foul odor from the wound/dressing, or calf pain - CONTACT YOUR SURGEON.                                                   FOLLOW-UP APPOINTMENTS:  If you do not already have a post-op appointment, please call the office for an appointment to be seen by your surgeon.  Guidelines for how soon to be seen are listed in your "After Visit Summary", but are typically between 2-3 weeks after surgery.  If you have a specialized bandage, you may be told to follow up 1 week after surgery.  POST-OPERATIVE OPIOID TAPER INSTRUCTIONS: It is important to wean off of your opioid medication as soon as possible. If you do not need pain medication after your surgery it is ok to stop day one. Opioids include: Codeine, Hydrocodone(Norco, Vicodin), Oxycodone(Percocet, oxycontin) and hydromorphone amongst others.  Long term and even short term use of opiods can cause: Increased pain response Dependence Constipation Depression Respiratory depression And more.  Withdrawal symptoms can include Flu like symptoms Nausea, vomiting And more Techniques to manage these symptoms Hydrate well Eat regular healthy meals Stay active Use relaxation techniques(deep breathing, meditating, yoga) Do Not substitute Alcohol to help with tapering If you have been on opioids for less than two weeks and do not have pain than it is ok to stop all together.  Plan to wean off of opioids This plan should start within one week post op of your joint replacement. Maintain the same interval or time between taking each dose and first decrease the dose.  Cut the total daily intake of opioids by one tablet each day Next start to increase the time between doses. The last dose that should be eliminated is the evening dose.   MAKE SURE YOU:  Understand these  instructions.  Get help right away if you are not doing well or get worse.    Thank you for letting us be a part of your medical care team.  It is a privilege we respect greatly.  We hope these instructions will help you stay on track for a fast and full recovery!

## 2023-03-02 NOTE — Anesthesia Postprocedure Evaluation (Signed)
Anesthesia Post Note  Patient: Allison Mcdaniel  Procedure(s) Performed: TOTAL HIP ARTHROPLASTY (Left: Hip)     Patient location during evaluation: PACU Anesthesia Type: Spinal Level of consciousness: awake and alert Pain management: pain level controlled Vital Signs Assessment: post-procedure vital signs reviewed and stable Respiratory status: spontaneous breathing, nonlabored ventilation, respiratory function stable and patient connected to nasal cannula oxygen Cardiovascular status: blood pressure returned to baseline and stable Postop Assessment: no apparent nausea or vomiting Anesthetic complications: no   No notable events documented.  Last Vitals:  Vitals:   03/02/23 1000 03/02/23 1015  BP: (!) 117/50 131/72  Pulse: 67 (!) 56  Resp: (!) 7 14  Temp:    SpO2: 99% 100%    Last Pain:  Vitals:   03/02/23 1015  TempSrc:   PainSc: 0-No pain                 Lewie Loron

## 2023-03-03 ENCOUNTER — Encounter (HOSPITAL_COMMUNITY): Payer: Self-pay | Admitting: Orthopedic Surgery
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
## 2023-03-05 ENCOUNTER — Ambulatory Visit: Payer: Medicare Other | Attending: Orthopedic Surgery

## 2023-03-05 DIAGNOSIS — M25552 Pain in left hip: Secondary | ICD-10-CM | POA: Insufficient documentation

## 2023-03-05 DIAGNOSIS — R262 Difficulty in walking, not elsewhere classified: Secondary | ICD-10-CM | POA: Insufficient documentation

## 2023-03-05 DIAGNOSIS — M6281 Muscle weakness (generalized): Secondary | ICD-10-CM | POA: Diagnosis present

## 2023-03-05 DIAGNOSIS — G8929 Other chronic pain: Secondary | ICD-10-CM | POA: Insufficient documentation
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
## 2023-03-10 ENCOUNTER — Ambulatory Visit: Payer: Medicare Other

## 2023-03-10 DIAGNOSIS — G8929 Other chronic pain: Secondary | ICD-10-CM

## 2023-03-10 DIAGNOSIS — M6281 Muscle weakness (generalized): Secondary | ICD-10-CM

## 2023-03-10 DIAGNOSIS — M25552 Pain in left hip: Secondary | ICD-10-CM | POA: Diagnosis not present

## 2023-03-10 DIAGNOSIS — R262 Difficulty in walking, not elsewhere classified: Secondary | ICD-10-CM

## 2023-03-12 ENCOUNTER — Ambulatory Visit: Payer: Medicare Other

## 2023-03-12 DIAGNOSIS — R262 Difficulty in walking, not elsewhere classified: Secondary | ICD-10-CM

## 2023-03-12 DIAGNOSIS — M6281 Muscle weakness (generalized): Secondary | ICD-10-CM

## 2023-03-12 DIAGNOSIS — G8929 Other chronic pain: Secondary | ICD-10-CM

## 2023-03-12 DIAGNOSIS — M25552 Pain in left hip: Secondary | ICD-10-CM | POA: Diagnosis not present
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
## 2023-03-17 ENCOUNTER — Ambulatory Visit: Payer: Medicare Other

## 2023-03-17 DIAGNOSIS — G8929 Other chronic pain: Secondary | ICD-10-CM

## 2023-03-17 DIAGNOSIS — R262 Difficulty in walking, not elsewhere classified: Secondary | ICD-10-CM

## 2023-03-17 DIAGNOSIS — M25552 Pain in left hip: Secondary | ICD-10-CM | POA: Diagnosis not present

## 2023-03-17 DIAGNOSIS — M6281 Muscle weakness (generalized): Secondary | ICD-10-CM
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
## 2023-03-19 ENCOUNTER — Ambulatory Visit: Payer: Medicare Other

## 2023-03-19 DIAGNOSIS — R262 Difficulty in walking, not elsewhere classified: Secondary | ICD-10-CM

## 2023-03-19 DIAGNOSIS — M6281 Muscle weakness (generalized): Secondary | ICD-10-CM

## 2023-03-19 DIAGNOSIS — M25552 Pain in left hip: Secondary | ICD-10-CM | POA: Diagnosis not present

## 2023-03-19 DIAGNOSIS — G8929 Other chronic pain: Secondary | ICD-10-CM

## 2023-03-24 ENCOUNTER — Ambulatory Visit: Payer: Medicare Other

## 2023-03-24 DIAGNOSIS — R262 Difficulty in walking, not elsewhere classified: Secondary | ICD-10-CM

## 2023-03-24 DIAGNOSIS — M25552 Pain in left hip: Secondary | ICD-10-CM | POA: Diagnosis not present

## 2023-03-24 DIAGNOSIS — M6281 Muscle weakness (generalized): Secondary | ICD-10-CM

## 2023-03-24 DIAGNOSIS — G8929 Other chronic pain: Secondary | ICD-10-CM
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
## 2023-03-26 ENCOUNTER — Ambulatory Visit: Payer: Medicare Other

## 2023-03-26 DIAGNOSIS — G8929 Other chronic pain: Secondary | ICD-10-CM

## 2023-03-26 DIAGNOSIS — R262 Difficulty in walking, not elsewhere classified: Secondary | ICD-10-CM

## 2023-03-26 DIAGNOSIS — M6281 Muscle weakness (generalized): Secondary | ICD-10-CM

## 2023-03-26 DIAGNOSIS — M25552 Pain in left hip: Secondary | ICD-10-CM | POA: Diagnosis not present
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
## 2023-03-31 ENCOUNTER — Ambulatory Visit: Payer: Medicare Other | Attending: Orthopedic Surgery

## 2023-03-31 DIAGNOSIS — M25552 Pain in left hip: Secondary | ICD-10-CM | POA: Insufficient documentation

## 2023-03-31 DIAGNOSIS — M25511 Pain in right shoulder: Secondary | ICD-10-CM | POA: Insufficient documentation

## 2023-03-31 DIAGNOSIS — R262 Difficulty in walking, not elsewhere classified: Secondary | ICD-10-CM | POA: Insufficient documentation

## 2023-03-31 DIAGNOSIS — M6281 Muscle weakness (generalized): Secondary | ICD-10-CM | POA: Insufficient documentation

## 2023-03-31 DIAGNOSIS — G8929 Other chronic pain: Secondary | ICD-10-CM | POA: Insufficient documentation
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
## 2023-04-02 ENCOUNTER — Ambulatory Visit: Payer: Medicare Other

## 2023-04-02 DIAGNOSIS — R262 Difficulty in walking, not elsewhere classified: Secondary | ICD-10-CM

## 2023-04-02 DIAGNOSIS — M25552 Pain in left hip: Secondary | ICD-10-CM | POA: Diagnosis not present

## 2023-04-02 DIAGNOSIS — M6281 Muscle weakness (generalized): Secondary | ICD-10-CM

## 2023-04-02 DIAGNOSIS — G8929 Other chronic pain: Secondary | ICD-10-CM
